# Patient Record
Sex: Male | Born: 1960 | Hispanic: No | Marital: Married | Smoking: Former smoker
Health system: Southern US, Community
[De-identification: ages and names within clinical notes are randomized; demographics above are authoritative.]

## PROBLEM LIST (undated history)

## (undated) DIAGNOSIS — K219 Gastro-esophageal reflux disease without esophagitis: Secondary | ICD-10-CM

## (undated) HISTORY — DX: Gastro-esophageal reflux disease without esophagitis: K21.9

---

## 2009-06-27 ENCOUNTER — Ambulatory Visit: Payer: Self-pay | Admitting: Internal Medicine

## 2009-06-27 DIAGNOSIS — N529 Male erectile dysfunction, unspecified: Secondary | ICD-10-CM

## 2009-06-27 DIAGNOSIS — K219 Gastro-esophageal reflux disease without esophagitis: Secondary | ICD-10-CM

## 2009-06-27 DIAGNOSIS — N401 Enlarged prostate with lower urinary tract symptoms: Secondary | ICD-10-CM | POA: Insufficient documentation

## 2009-07-01 ENCOUNTER — Encounter: Payer: Self-pay | Admitting: Internal Medicine

## 2010-03-31 ENCOUNTER — Telehealth: Payer: Self-pay | Admitting: Internal Medicine

## 2010-04-01 ENCOUNTER — Ambulatory Visit: Payer: Self-pay | Admitting: Internal Medicine

## 2010-04-01 DIAGNOSIS — N453 Epididymo-orchitis: Secondary | ICD-10-CM | POA: Insufficient documentation

## 2010-04-01 DIAGNOSIS — E291 Testicular hypofunction: Secondary | ICD-10-CM

## 2010-04-01 LAB — CONVERTED CEMR LAB
Bilirubin Urine: NEGATIVE
Hemoglobin, Urine: NEGATIVE
Ketones, ur: NEGATIVE mg/dL
Nitrite: NEGATIVE
Specific Gravity, Urine: 1.025 (ref 1.000–1.030)
Urine Glucose: NEGATIVE mg/dL
Urobilinogen, UA: 0.2 (ref 0.0–1.0)
pH: 6 (ref 5.0–8.0)

## 2010-04-18 ENCOUNTER — Ambulatory Visit: Payer: Self-pay | Admitting: Internal Medicine

## 2010-04-18 DIAGNOSIS — L989 Disorder of the skin and subcutaneous tissue, unspecified: Secondary | ICD-10-CM | POA: Insufficient documentation

## 2010-11-09 LAB — CONVERTED CEMR LAB
ALT: 32 units/L (ref 0–53)
BUN: 15 mg/dL (ref 6–23)
Basophils Relative: 0.8 % (ref 0.0–3.0)
Bilirubin Urine: NEGATIVE
CO2: 29 meq/L (ref 19–32)
Chloride: 104 meq/L (ref 96–112)
Direct LDL: 148.2 mg/dL
Eosinophils Relative: 5.8 % — ABNORMAL HIGH (ref 0.0–5.0)
HCT: 42.3 % (ref 39.0–52.0)
Hemoglobin, Urine: NEGATIVE
Ketones, ur: NEGATIVE mg/dL
Leukocytes, UA: NEGATIVE
Lymphs Abs: 2.1 10*3/uL (ref 0.7–4.0)
MCHC: 33.9 g/dL (ref 30.0–36.0)
MCV: 89.8 fL (ref 78.0–100.0)
Monocytes Absolute: 0.4 10*3/uL (ref 0.1–1.0)
Platelets: 230 10*3/uL (ref 150.0–400.0)
Potassium: 3.8 meq/L (ref 3.5–5.1)
RBC: 4.71 M/uL (ref 4.22–5.81)
TSH: 0.55 microintl units/mL (ref 0.35–5.50)
Testosterone: 135.8 ng/dL — ABNORMAL LOW (ref 350.00–890.00)
Total Protein: 8 g/dL (ref 6.0–8.3)
Urobilinogen, UA: 0.2 (ref 0.0–1.0)
VLDL: 16 mg/dL (ref 0.0–40.0)
WBC: 5.6 10*3/uL (ref 4.5–10.5)

## 2010-11-12 NOTE — Assessment & Plan Note (Signed)
Summary: GROIN PAIN/NWS   Vital Signs:  Patient profile:   50 year old male Height:      72 inches Weight:      234.25 pounds BMI:     31.88 O2 Sat:      98 % on Room air Temp:     98.2 degrees F oral Pulse rate:   70 / minute BP sitting:   118 / 80  (left arm) Cuff size:   regular  Vitals Entered By: Margaret Pyle, CMA (April 01, 2010 4:43 PM)  O2 Flow:  Room air CC: LT groin & LBP pain x 3 days   Primary Care Provider:  Etta Grandchild MD  CC:  LT groin & LBP pain x 3 days.  History of Present Illness: here with acute onset 3 days mild to mod left testicle tender adn swelling, assoc wtih radiating pain to the lower sacral area with urination, but no dysuria, freq, urgency, hematuria, flank pain, n/v, high fever and chills.  Seemed to start after an evening of ETOH indulgence and asks if this could be related.  Pt denies CP, sob, doe, wheezing, orthopnea, pnd, worsening LE edema, palps, dizziness or syncope   No prior hx of UTI, but hs remote hx or prior prostatitis with similar pain to the lower back.  No bowel change, or LE pain, weak, numb, wt loss, night sweats.   No change in minor prostatism symptoms , declines need for flomax trial.  ED symptoms persist but improved with PDE5.  Low testosterone noted recent labs  Problems Prior to Update: 1)  Orchitis  (ICD-604.90) 2)  Hypertrophy Prostate W/ur Obst & Oth Luts  (ICD-600.01) 3)  Routine General Medical Exam@[facility]  (ICD-V70.0) 4)  Erectile Dysfunction, Organic  (ICD-607.84) 5)  Gerd  (ICD-530.81)  Medications Prior to Update: 1)  Cialis 5 Mg Tabs (Tadalafil) .... Once Daily As Directed  Current Medications (verified): 1)  Cialis 5 Mg Tabs (Tadalafil) .... Once Daily As Directed 2)  Ciprofloxacin Hcl 500 Mg Tabs (Ciprofloxacin Hcl) .Marland Kitchen.. 1 By Mouth Two Times A Day  Allergies (verified): No Known Drug Allergies  Past History:  Past Medical History: Last updated: 06/27/2009 GERD  Past Surgical  History: Last updated: 06/27/2009 Denies surgical history  Social History: Last updated: 04/01/2010 Married Alcohol use-yes  Risk Factors: Alcohol Use: 2 (06/27/2009) >5 drinks/d w/in last 3 months: no (06/27/2009)  Risk Factors: Smoking Status: quit > 6 months (06/27/2009)  Social History: Reviewed history and no changes required. Married Alcohol use-yes  Review of Systems       all otherwise negative per pt -    Physical Exam  General:  alert and overweight-appearing.   Head:  normocephalic and atraumatic.   Eyes:  vision grossly intact, pupils equal, and pupils round.   Ears:  R ear normal and L ear normal.   Nose:  no external deformity and no nasal discharge.   Mouth:  no gingival abnormalities and pharynx pink and moist.   Neck:  supple and no masses.   Lungs:  normal respiratory effort and normal breath sounds.   Heart:  normal rate and regular rhythm.   Abdomen:  soft, non-tender, and normal bowel sounds.   Genitalia:  Testes bilaterally descended without nodularity, tenderness or masses. No scrotal masses or lesions. No penis lesions or urethral discharge. except for left testicle mild to mod tender and swelling Msk:  no spine tender, no flank tender, no sacral tender Extremities:  no  edema, no erythema  Neurologic:  strength normal in all extremities and gait normal.     Impression & Recommendations:  Problem # 1:  ORCHITIS (ICD-604.90) mild to mod, for urine studies, treat as above - wth cipro , f/u any worsening signs or symptoms  - d/w pt, unlikely to be related to ETOH use Orders: T-Culture, Urine (51884-16606) TLB-Udip w/ Micro (81001-URINE)  Problem # 2:  HYPERTROPHY PROSTATE W/UR OBST & OTH LUTS (ICD-600.01) stable overall by hx and exam, ok to continue meds/tx as is   Problem # 3:  ERECTILE DYSFUNCTION, ORGANIC (ICD-607.84)  His updated medication list for this problem includes:    Cialis 5 Mg Tabs (Tadalafil) ..... Once daily as  directed improved, Continue all previous medications as before this visit   Problem # 4:  HYPOGONADISM (ICD-257.2) ok to hold on hormone replacement - d/w pt at his request  Complete Medication List: 1)  Cialis 5 Mg Tabs (Tadalafil) .... Once daily as directed 2)  Ciprofloxacin Hcl 500 Mg Tabs (Ciprofloxacin hcl) .Marland Kitchen.. 1 by mouth two times a day  Patient Instructions: 1)  Please take all new medications as prescribed 2)  Continue all previous medications as before this visit  3)  Please go to the Lab in the basement for your urine tests today  4)  Please schedule an appointment with your primary doctor  - Dr Yetta Barre, in sept 2011 with CPX labs Prescriptions: CIPROFLOXACIN HCL 500 MG TABS (CIPROFLOXACIN HCL) 1 by mouth two times a day  #20 x 0   Entered and Authorized by:   Corwin Levins MD   Signed by:   Corwin Levins MD on 04/01/2010   Method used:   Print then Give to Patient   RxID:   [id number]

## 2010-11-12 NOTE — Assessment & Plan Note (Signed)
Summary: BOIL ON BACK /NWS  #   Vital Signs:  Patient profile:   50 year old male Height:      72 inches Weight:      234 pounds BMI:     31.85 O2 Sat:      96 % on Room air Temp:     98.5 degrees F oral Pulse rate:   74 / minute BP sitting:   122 / 80  (left arm) Cuff size:   regular  Vitals Entered By: Bill Salinas CMA (April 18, 2010 3:39 PM)  O2 Flow:  Room air  CC: pt here for evaluation of lump on his back/ ab   Primary Care Provider:  Etta Grandchild MD  CC:  pt here for evaluation of lump on his back/ ab.  History of Present Illness: here to f/u lump on the left upper back; wife has been more concerned lately but he does not think it enlarging, denies pain, redness, fever, drainage and does not hurt to lie on it.  Also concerned about occasional aching to the left testiscle, but denies recurrence of pain, sweling adn any GU symptoms such as dysuria, freq, urgency or hematuria.  Does have significant ED symptoms however though libido is intact, has ongoing stressors recently but denies worsening depressive symtpoms, suicidal ideation or panic.    Problems Prior to Update: 1)  Skin Lesion  (ICD-709.9) 2)  Hypogonadism  (ICD-257.2) 3)  Orchitis  (ICD-604.90) 4)  Hypertrophy Prostate W/ur Obst & Oth Luts  (ICD-600.01) 5)  Routine General Medical Exam@health  Care [facility]  (ICD-V70.0) 6)  Erectile Dysfunction, Organic  (ICD-607.84) 7)  Gerd  (ICD-530.81)  Medications Prior to Update: 1)  Cialis 5 Mg Tabs (Tadalafil) .... Once Daily As Directed 2)  Ciprofloxacin Hcl 500 Mg Tabs (Ciprofloxacin Hcl) .Marland Kitchen.. 1 By Mouth Two Times A Day  Current Medications (verified): 1)  Cialis 20 Mg Tabs (Tadalafil) .Marland Kitchen.. 1po Every Other Day As Needed  Allergies (verified): No Known Drug Allergies  Past History:  Past Medical History: Last updated: 06/27/2009 GERD  Past Surgical History: Last updated: 06/27/2009 Denies surgical history  Social History: Last updated:  04/01/2010 Married Alcohol use-yes  Risk Factors: Alcohol Use: 2 (06/27/2009) >5 drinks/d w/in last 3 months: no (06/27/2009)  Risk Factors: Smoking Status: quit > 6 months (06/27/2009)  Review of Systems       all otherwise negative per pt -    Physical Exam  General:  alert and overweight-appearing.   Head:  normocephalic and atraumatic.   Eyes:  vision grossly intact, pupils equal, and pupils round.   Ears:  R ear normal and L ear normal.   Nose:  no external deformity and no nasal discharge.   Mouth:  no gingival abnormalities and pharynx pink and moist.   Neck:  supple and no masses.   Lungs:  normal respiratory effort and normal breath sounds.   Heart:  normal rate and regular rhythm.   Genitalia:  Testes bilaterally descended without nodularity, tenderness or masses. No scrotal masses or lesions. No penis lesions or urethral discharge. Extremities:  no edema, no erythema  Skin:  left upper back approx 2 cm subq soft lesion without distinct margins, and not tender, red or d/c Psych:  moderately anxious.     Impression & Recommendations:  Problem # 1:  SKIN LESION (ICD-709.9) c/w lipoma, cyst or possbiel lesion such as dermatofibroma;  reassured, educated - ok to folllow with expectant management  Problem #  2:  ORCHITIS (ICD-604.90) exam benign, ok to follow  Problem # 3:  ERECTILE DYSFUNCTION, ORGANIC (ICD-607.84)  His updated medication list for this problem includes:    Cialis 20 Mg Tabs (Tadalafil) .Marland Kitchen... 1po every other day as needed treat as above, f/u any worsening signs or symptoms , ? psychogenic element  Complete Medication List: 1)  Cialis 20 Mg Tabs (Tadalafil) .Marland Kitchen.. 1po every other day as needed  Patient Instructions: 1)  Please take all new medications as prescribed 2)  Continue all previous medications as before this visit  3)  Please schedule a follow-up appointment in 2 months with CPX labs and:  v70.0 4)  total testosterone   607.84 Prescriptions: CIALIS 20 MG TABS (TADALAFIL) 1po every other day as needed  #5 x 11   Entered and Authorized by:   Corwin Levins MD   Signed by:   Corwin Levins MD on 04/18/2010   Method used:   Print then Give to Patient   RxID:   [id number]    Immunization History:  Tetanus/Td Immunization History:    Tetanus/Td:  historical (01/11/2006)

## 2010-11-12 NOTE — Progress Notes (Signed)
Summary: Call Report  Phone Note Other Incoming   Caller: [facility] Call Report Summary of Call: [facility] Triage Call Report Triage Record Num: [id number] Operator: Aundra Millet Patient Name: [facility] Call Date & Time: 03/30/2010 12:51:22AM Patient Phone: [phone] PCP: Sonda Primes Patient Gender: Male PCP Fax : [phone] Patient DOB: July 04, 1961 Practice Name: [facility] Reason for Call: Wife/ Rochelle calling - Today, 03/29/2010 am had onset of testicular pain and is tender to touch that has continued all day. Rn spoke to pt and he stated that tonight, he rates the testicular pain 1/ 10 and has been asleep. No urinary sx's. No fever. No injury. No swelling. RN advised to be seen at [facility] within the next 24 hrs but if increasing pain, swelling go to [facility] ED and pt agreed. Protocol(s) Used: Scrotum / Testicles Symptoms Recommended Outcome per Protocol: See Provider within 24 hours Reason for Outcome: Pain in testicle(s)/scrotum AND pain or felt something "pass" with urination Care Advice:  ~ Avoid sexual intercourse.  ~ Call provider if symptoms worsen or new symptoms develop.  ~ Call provider if symptoms become severe or recurring. Increase intake of fluids. Try to drink 8 oz. (.2 liter) every hour when awake, including unsweetened cranberry juice, unless on restricted fluids for other medical reasons. Take sips of fluid or eat ice chips if nauseated or vomiting.  ~ 06/ Initial call taken by: Margaret Pyle, CMA,  March 31, 2010 10:12 AM

## 2011-05-11 ENCOUNTER — Ambulatory Visit: Payer: Self-pay | Admitting: Internal Medicine

## 2011-05-11 ENCOUNTER — Encounter: Payer: Self-pay | Admitting: Internal Medicine

## 2011-05-15 ENCOUNTER — Other Ambulatory Visit (INDEPENDENT_AMBULATORY_CARE_PROVIDER_SITE_OTHER): Payer: 59

## 2011-05-15 ENCOUNTER — Ambulatory Visit (INDEPENDENT_AMBULATORY_CARE_PROVIDER_SITE_OTHER): Payer: 59 | Admitting: Internal Medicine

## 2011-05-15 ENCOUNTER — Encounter: Payer: Self-pay | Admitting: Internal Medicine

## 2011-05-15 VITALS — BP 116/80 | HR 79 | Temp 98.4°F | Resp 16 | Wt 235.0 lb

## 2011-05-15 DIAGNOSIS — R319 Hematuria, unspecified: Secondary | ICD-10-CM | POA: Insufficient documentation

## 2011-05-15 DIAGNOSIS — N410 Acute prostatitis: Secondary | ICD-10-CM

## 2011-05-15 DIAGNOSIS — N401 Enlarged prostate with lower urinary tract symptoms: Secondary | ICD-10-CM

## 2011-05-15 DIAGNOSIS — N159 Renal tubulo-interstitial disease, unspecified: Secondary | ICD-10-CM

## 2011-05-15 DIAGNOSIS — IMO0002 Reserved for concepts with insufficient information to code with codable children: Secondary | ICD-10-CM

## 2011-05-15 DIAGNOSIS — M5416 Radiculopathy, lumbar region: Secondary | ICD-10-CM

## 2011-05-15 LAB — COMPREHENSIVE METABOLIC PANEL
ALT: 29 U/L (ref 0–53)
AST: 22 U/L (ref 0–37)
Albumin: 4.4 g/dL (ref 3.5–5.2)
Alkaline Phosphatase: 61 U/L (ref 39–117)
BUN: 20 mg/dL (ref 6–23)
Potassium: 4.6 mEq/L (ref 3.5–5.1)
Sodium: 140 mEq/L (ref 135–145)
Total Protein: 7.3 g/dL (ref 6.0–8.3)

## 2011-05-15 LAB — CBC WITH DIFFERENTIAL/PLATELET
Basophils Absolute: 0 10*3/uL (ref 0.0–0.1)
Eosinophils Absolute: 0.1 10*3/uL (ref 0.0–0.7)
MCHC: 34.2 g/dL (ref 30.0–36.0)
MCV: 90.2 fl (ref 78.0–100.0)
Monocytes Absolute: 0.6 10*3/uL (ref 0.1–1.0)
Neutrophils Relative %: 53.2 % (ref 43.0–77.0)
Platelets: 239 10*3/uL (ref 150.0–400.0)
RDW: 13.5 % (ref 11.5–14.6)

## 2011-05-15 LAB — POCT URINALYSIS DIPSTICK
Bilirubin, UA: NEGATIVE
Leukocytes, UA: NEGATIVE
Nitrite, UA: NEGATIVE
pH, UA: 6

## 2011-05-15 MED ORDER — IBUPROFEN 600 MG PO TABS: 600.0000 mg | ORAL_TABLET | Freq: Four times a day (QID) | ORAL | Status: AC | PRN

## 2011-05-15 MED ORDER — CIPROFLOXACIN HCL 500 MG PO TABS: 500.0000 mg | ORAL_TABLET | Freq: Two times a day (BID) | ORAL | Status: AC

## 2011-05-15 NOTE — Assessment & Plan Note (Signed)
Treat the infection

## 2011-05-15 NOTE — Patient Instructions (Signed)
Prostatitis Prostatitis is an inflammation (the body's way of reacting to injury and/or infection) of the prostate gland. The prostate gland is a male organ. The gland is about the size and shape of a walnut. The prostate is located just below the bladder. It produces semen, which is a fluid that helps nourish and transport sperm. Prostatitis is the most common urinary tract problem in men younger than age 48. There are 4 categories of prostatitis:  I - Acute bacterial prostatitis.   II - Chronic bacterial prostatitis.   III - Chronic prostatitis and chronic pelvic pain syndrome (CPPS).   Inflammatory.   Non inflammatory.   IV - Asymptomatic inflammatory prostatitis.  Acute and chronic bacterial prostatitis are problems with bacterial infections of the prostate. "Acute" infection is usually a one-time problem. "Chronic" bacterial prostatitis is a condition with recurrent infection. It is usually caused by the same germ (bacteria). CPPS has symptoms similar to prostate infection. However, no infection is actually found. This condition can cause problems of ongoing pain. Currently, it cannot be cured. Treatments are available and aimed at symptom control.  Asymptomatic inflammatory prostatitis has no symptoms. It is a condition where infection-fighting cells are found by chance in the urine. The diagnosis is made most often during an exam for other conditions. Other conditions could be infertility or a high level of PSA (prostate-specific antigen) in the blood. SYMPTOMS Symptoms can vary depending upon the type of prostatitis that exists. There can also be overlap in symptoms. This can make diagnosis difficult. Symptoms: For Acute bacterial prostatitis  Painful urination.  Fever and/or chills.   Muscle and/or joint pains.   Low back pain.   Low abdominal pain.   Inability to empty bladder completely.   Sudden urges to urinate.  Frequent urination during the day.   Difficulty  starting urine stream.   Need to urinate several times at night (nocturia).   Weak urine stream.   Urethral (tube that carries urine from the bladder out of the body) discharge and dribbling after urination.   For Chronic bacterial prostatitis  Rectal pain.   Pain in the testicles, penis, or tip of the penis.   Pain in the space between the anus and scrotum (perineum).   Low back pain.   Low abdominal pain.   Problems with sexual function.   Painful ejaculation.   Bloody semen.   Inability to empty bladder completely.   Painful urination.   Sudden urges to urinate.   Frequent urination during the day.   Difficulty starting urine stream.   Need to urinate several times at night (nocturia).   Weak urine stream.   Dribbling after urination.   Urethral discharge.   For Chronic prostatitis and chronic pelvic pain syndrome (CPPS) Symptoms are the same as those for chronic bacterial prostatitis. Problems with sexual function are often the reason for seeking care. This important problem should be discussed with your caregiver. For Asymptomatic inflammatory prostatitis As noted above, there are no symptoms with this condition. DIAGNOSIS  Your caregiver may perform a rectal exam. This exam is to determine if the prostate is swollen and tender.   Sometimes blood work is performed. This is done to see if your white blood cell count is elevated. The Prostate Specific Antigen (PSA) is also measured. PSA is a blood test that can help detect early prostate cancer.   A urinalysis is done to find out what type of infection is present if this is a suspected cause. An additional  urinalysis may be done after a digital rectal exam. This is to see if white blood cells are pushed out of the prostate and into the urine. A low-grade infection of the prostate may not be found on the first urinalysis.  In more difficult cases, your caregiver may advise other tests. Tests could  include:  Urodynamics -- Tests the function of the bladder and the organs involved in triggering and controlling normal urination.   Urine flow rate.   Cystoscopy -- In this procedure, a thin, telescope-like tube with a light and tiny camera attached (cystoscope) is inserted into the bladder through the urethra. This allows the caregiver to see the inside of the urethra and bladder.   Electromyography -- This procedure tests how the muscles and nerves of the bladder work. It is focused on the muscles that control the anus and pelvic floor. These are the muscles between the anus and scrotum.  In people who show no signs of infection, certain uncommon infections might be causing constant or recurrent symptoms. These uncommon infections are difficult to detect. More work in medicine may help find solutions to these problems. TREATMENT Antibiotics are used to treat infections caused by germs. If the infection is not treated and becomes long lasting (chronic), it may become a lower grade infection with minor, continual problems. Without treatment, the prostate may develop a boil or furuncle (abscess). This may require surgical treatment. For those with chronic prostatitis and CPPS, it is important to work closely with your primary caregiver and urologist. For some, the medicines that are used to treat a non-cancerous, enlarged prostate (benign prostatic hypertrophy) may be helpful. Referrals to specialists other than urologists may be necessary. In rare cases when all treatments have been inadequate for pain control, an operation to remove the prostate may be recommended. This is very rare and before this is considered thorough discussion with your urologist is highly recommended.  In cases of secondary to chronic non-bacterial prostatitis, a good relationship with your urologist or primary caregiver is essential because it is often a recurrent prolonged condition that requires a good understanding of the  causes and a commitment to therapy aimed at controlling your symptoms. HOME CARE INSTRUCTIONS  Hot sitz baths for 20 minutes, 4 times per day, may help relieve pain.   Non-prescription pain killers may be used as your caregiver recommends if you have no allergies to them. Some illnesses or conditions prevent use of non-prescription drugs. If unsure, check with your caregiver. Take all medications as directed. Take the antibiotics for the prescribed length of time, even if you are feeling better.  SEEK MEDICAL CARE IF:  You have any worsening of the symptoms that originally brought you to your caregiver.   You have an oral temperature above 100.5.   You experience any side effects from medications prescribed.  SEEK IMMEDIATE MEDICAL CARE IF: You have an oral temperature above 100.5Back Pain (Lumbosacral Strain) Back pain is one of the most common causes of pain. There are many causes of back pain. Most are not serious conditions.  CAUSES Your backbone (spinal column) is made up of 24 main vertebral bodies, the sacrum, and the coccyx. These are held together by muscles and tough, fibrous tissue (ligaments). Nerve roots pass through the openings between the vertebrae. A sudden move or injury to the back may cause injury to, or pressure on, these nerves. This may result in localized back pain or pain movement (radiation) into the buttocks, down the leg, and into  the foot. Sharp, shooting pain from the buttock down the back of the leg (sciatica) is frequently associated with a ruptured (herniated) disc. Pain may be caused by muscle spasm alone. Your caregiver can often find the cause of your pain by the details of your symptoms and an exam. In some cases, you may need tests (such as X-rays). Your caregiver will work with you to decide if any tests are needed based on your specific exam. HOME CARE INSTRUCTIONS Avoid an underactive lifestyle. Active exercise, as directed by your caregiver, is your  greatest weapon against back pain.  Avoid hard physical activities (tennis, racquetball, water-skiing) if you are not in proper physical condition for it. This may aggravate and/or create problems.  If you have a back problem, avoid sports requiring sudden body movements. Swimming and walking are generally safer activities.  Maintain good posture.  Avoid becoming overweight (obese).  Use bed rest for only the most extreme, sudden (acute) episode. Your caregiver will help you determine how much bed rest is necessary.  For acute conditions, you may put ice on the injured area.  Put ice in a plastic bag.  Place a towel between your skin and the bag.  Leave the ice on for 20 minutes at a time, every 2 hours, or as needed.  After you are improved and more active, it may help to apply heat for 30 minutes before activities.  See your caregiver if you are having pain that lasts longer than expected. Your caregiver can advise appropriate exercises and/or therapy if needed. With conditioning, most back problems can be avoided. SEEK IMMEDIATE MEDICAL CARE IF: You have numbness, tingling, weakness, or problems with the use of your arms or legs.  You experience severe back pain not relieved with medicines.  There is a change in bowel or bladder control.  You have increasing pain in any area of the body, including your belly (abdomen).  You notice shortness of breath, dizziness, or feel faint.  You feel sick to your stomach (nauseous), are throwing up (vomiting), or become sweaty.  You notice discoloration of your toes or legs, or your feet get very cold.  Your back pain is getting worse.  You have an oral temperature above 100.5, not controlled by medicine.  MAKE SURE YOU:  Understand these instructions.  Will watch your condition.  Will get help right away if you are not doing well or get worse.  Document Released: 07/08/2005 Document Re-Released: 12/23/2009  [facility] Patient Information 2011  [place], [place]., not controlled by medicine.   You have pain not relieved with medications.   You develop nausea, vomiting, lightheadedness, or have a fainting episode.   You are unable to urinate.   You pass bloody urine or clots.  Document Released: 09/25/2000 Document Re-Released: 12/23/2009 [facility] Patient Information 2011 [place], [place].

## 2011-05-15 NOTE — Assessment & Plan Note (Signed)
Start cipro, check urine culture, screen for cancer with a PSA

## 2011-05-15 NOTE — Progress Notes (Signed)
Subjective:    Patient ID: Troy Rangel, male    DOB: [birth year], 50 y.o.   MRN: [id number]  Back Pain This is a chronic problem. The current episode started more than 1 year ago. The problem occurs intermittently. The problem has been gradually worsening since onset. The pain is present in the lumbar spine. The quality of the pain is described as aching and burning. The pain radiates to the right thigh. The pain is at a severity of 3/10. The pain is moderate. The pain is worse during the day. The symptoms are aggravated by bending, twisting and stress. Stiffness is present all day. Associated symptoms include leg pain. Pertinent negatives include no abdominal pain, bladder incontinence, bowel incontinence, chest pain, dysuria, fever, headaches, numbness, paresis, paresthesias, pelvic pain, perianal numbness, tingling, weakness or weight loss. Risk factors include lack of exercise and sedentary lifestyle. He has tried nothing for the symptoms.  Dysuria  This is a new problem. The current episode started 1 to 4 weeks ago. The problem occurs intermittently. The problem has been gradually worsening. The quality of the pain is described as burning. The pain is at a severity of 1/10. The pain is mild. There has been no fever. He is sexually active. There is no history of pyelonephritis. Pertinent negatives include no chills, discharge, flank pain, frequency, hematuria, hesitancy, nausea, sweats, urgency or vomiting. He has tried nothing for the symptoms.      Review of Systems  Constitutional: Negative for fever, chills, weight loss, diaphoresis, activity change, appetite change, fatigue and unexpected weight change.  HENT: Negative for sore throat, facial swelling, trouble swallowing, neck pain, neck stiffness and voice change.   Eyes: Negative for photophobia, redness and visual disturbance.  Respiratory: Negative for apnea, cough, choking, chest tightness, shortness of breath, wheezing and stridor.    Cardiovascular: Negative for chest pain, palpitations and leg swelling.  Gastrointestinal: Negative for nausea, vomiting, abdominal pain, diarrhea, constipation, abdominal distention and bowel incontinence.  Genitourinary: Negative for bladder incontinence, dysuria, hesitancy, urgency, frequency, hematuria, flank pain, decreased urine volume, discharge, penile swelling, scrotal swelling, enuresis, difficulty urinating, genital sores, penile pain, testicular pain and pelvic pain.  Musculoskeletal: Positive for back pain. Negative for myalgias, joint swelling, arthralgias and gait problem.  Skin: Negative for color change, pallor, rash and wound.  Neurological: Negative for dizziness, tingling, tremors, seizures, syncope, facial asymmetry, speech difficulty, weakness, light-headedness, numbness, headaches and paresthesias.  Hematological: Negative for adenopathy. Does not bruise/bleed easily.  Psychiatric/Behavioral: Negative for suicidal ideas, hallucinations, behavioral problems, confusion, sleep disturbance, self-injury, dysphoric mood, decreased concentration and agitation. The patient is not nervous/anxious and is not hyperactive.        Objective:   Physical Exam  Vitals reviewed. Constitutional: He is oriented to person, place, and time. He appears well-developed and well-nourished. No distress.  HENT:  Head: Normocephalic and atraumatic.  Right Ear: External ear normal.  Left Ear: External ear normal.  Nose: Nose normal.  Mouth/Throat: Oropharynx is clear and moist. No oropharyngeal exudate.  Eyes: Conjunctivae and EOM are normal. Pupils are equal, round, and reactive to light. Right eye exhibits no discharge. Left eye exhibits no discharge. No scleral icterus.  Neck: Normal range of motion. Neck supple. No JVD present. No tracheal deviation present. No thyromegaly present.  Cardiovascular: Normal rate, regular rhythm, normal heart sounds and intact distal pulses.  Exam reveals no  gallop and no friction rub.   No murmur heard. Pulmonary/Chest: Effort normal and breath sounds normal. No stridor. No  respiratory distress. He has no wheezes. He has no rales. He exhibits no tenderness.  Abdominal: Soft. Normal appearance and bowel sounds are normal. He exhibits no shifting dullness, no distension, no pulsatile liver, no fluid wave, no abdominal bruit, no ascites, no pulsatile midline mass and no mass. There is no hepatosplenomegaly, splenomegaly or hepatomegaly. There is no tenderness. There is no rebound, no guarding and no CVA tenderness. No hernia. Hernia confirmed negative in the ventral area, confirmed negative in the right inguinal area and confirmed negative in the left inguinal area.  Genitourinary: Rectum normal, testes normal and penis normal. Rectal exam shows no external hemorrhoid, no internal hemorrhoid, no fissure, no mass, no tenderness and anal tone normal. Guaiac negative stool. Prostate is enlarged (1+ boggy bilateral hypertrophy) and tender. Right testis shows no mass, no swelling and no tenderness. Right testis is descended. Cremasteric reflex is not absent on the right side. Left testis shows no mass, no swelling and no tenderness. Left testis is descended. Cremasteric reflex is not absent on the left side. Circumcised. No penile erythema or penile tenderness. No discharge found.  Musculoskeletal:       Lumbar back: Normal. He exhibits normal range of motion, no tenderness, no bony tenderness, no swelling, no edema, no deformity, no laceration, no pain, no spasm and normal pulse.  Lymphadenopathy:    He has no cervical adenopathy.       Right: No inguinal adenopathy present.       Left: No inguinal adenopathy present.  Neurological: He is alert and oriented to person, place, and time. He has normal strength. He is not disoriented. He displays no atrophy, no tremor and normal reflexes. No cranial nerve deficit or sensory deficit. He exhibits normal muscle tone. He  displays a negative Romberg sign. He displays no seizure activity. Coordination and gait normal.  Reflex Scores:      Tricep reflexes are 1+ on the right side and 1+ on the left side.      Bicep reflexes are 1+ on the right side and 1+ on the left side.      Brachioradialis reflexes are 1+ on the right side and 1+ on the left side.      Patellar reflexes are 1+ on the right side and 1+ on the left side.      Achilles reflexes are 1+ on the right side and 1+ on the left side. Skin: Skin is warm and dry. No rash noted. He is not diaphoretic. No erythema. No pallor.  Psychiatric: He has a normal mood and affect. His behavior is normal. Judgment and thought content normal.      Lab Results  Component Value Date   WBC 5.6 06/27/2009   HGB 14.3 06/27/2009   HCT 42.3 06/27/2009   PLT 230.0 06/27/2009   CHOL 203* 06/27/2009   TRIG 80.0 06/27/2009   HDL 45.50 06/27/2009   LDLDIRECT 148.2 06/27/2009   ALT 32 06/27/2009   AST 26 06/27/2009   NA 140 06/27/2009   K 3.8 06/27/2009   CL 104 06/27/2009   CREATININE 0.8 06/27/2009   BUN 15 06/27/2009   CO2 29 06/27/2009   TSH 0.55 06/27/2009   PSA 0.27 06/27/2009     Assessment & Plan:

## 2011-05-15 NOTE — Assessment & Plan Note (Signed)
Try some nsdais for pain and get MRI done as it sounds like he has a herniated disc with spinal stenosis +/- impingement

## 2011-05-15 NOTE — Assessment & Plan Note (Signed)
Check urine culture  

## 2011-05-17 LAB — CULTURE, URINE COMPREHENSIVE
Colony Count: NO GROWTH
Organism ID, Bacteria: NO GROWTH

## 2011-05-20 ENCOUNTER — Other Ambulatory Visit: Payer: Self-pay | Admitting: Internal Medicine

## 2011-05-20 DIAGNOSIS — M5416 Radiculopathy, lumbar region: Secondary | ICD-10-CM

## 2011-05-21 ENCOUNTER — Ambulatory Visit: Payer: Self-pay | Admitting: Internal Medicine

## 2011-06-12 ENCOUNTER — Encounter: Payer: 59 | Attending: Neurosurgery | Admitting: Neurosurgery

## 2012-06-10 ENCOUNTER — Emergency Department: Payer: Self-pay | Admitting: Emergency Medicine

## 2012-06-10 LAB — CBC
HCT: 41.5 %
HGB: 14.5 g/dL
MCH: 31.4 pg
MCHC: 34.9 g/dL
MCV: 90 fL
Platelet: 235 x10 3/mm 3
RBC: 4.61 x10 6/mm 3
RDW: 13.4 %
WBC: 5.2 x10 3/mm 3

## 2012-06-10 LAB — BASIC METABOLIC PANEL WITH GFR
Anion Gap: 7
BUN: 18 mg/dL
Calcium, Total: 8.7 mg/dL
Chloride: 104 mmol/L
Co2: 30 mmol/L
Creatinine: 1.12 mg/dL
EGFR (African American): >60
EGFR (Non-African Amer.): >60
Glucose: 113 mg/dL — ABNORMAL HIGH
Osmolality: 284
Potassium: 3.7 mmol/L
Sodium: 141 mmol/L

## 2012-06-10 LAB — CK TOTAL AND CKMB (NOT AT ARMC)
CK, Total: 171 U/L (ref 35–232)
CK-MB: 1.4 ng/mL (ref 0.5–3.6)

## 2012-06-10 LAB — TROPONIN I: Troponin-I: <0.02 ng/mL

## 2019-06-04 ENCOUNTER — Other Ambulatory Visit: Payer: Self-pay

## 2019-06-04 ENCOUNTER — Encounter: Payer: Self-pay | Admitting: Emergency Medicine

## 2019-06-04 ENCOUNTER — Inpatient Hospital Stay
Admission: EM | Admit: 2019-06-04 | Discharge: 2019-06-05 | DRG: 309 | Disposition: A | Discharge status: Home / Self Care | Payer: BLUE CROSS/BLUE SHIELD | Attending: Internal Medicine | Admitting: Internal Medicine

## 2019-06-04 ENCOUNTER — Emergency Department: Payer: BLUE CROSS/BLUE SHIELD

## 2019-06-04 DIAGNOSIS — Y900 Blood alcohol level of less than 20 mg/100 ml: Secondary | ICD-10-CM | POA: Diagnosis present

## 2019-06-04 DIAGNOSIS — K219 Gastro-esophageal reflux disease without esophagitis: Secondary | ICD-10-CM | POA: Diagnosis present

## 2019-06-04 DIAGNOSIS — K449 Diaphragmatic hernia without obstruction or gangrene: Secondary | ICD-10-CM | POA: Diagnosis present

## 2019-06-04 DIAGNOSIS — I4891 Unspecified atrial fibrillation: Principal | ICD-10-CM | POA: Diagnosis present

## 2019-06-04 DIAGNOSIS — Z8249 Family history of ischemic heart disease and other diseases of the circulatory system: Secondary | ICD-10-CM

## 2019-06-04 DIAGNOSIS — Z7982 Long term (current) use of aspirin: Secondary | ICD-10-CM

## 2019-06-04 DIAGNOSIS — Z20828 Contact with and (suspected) exposure to other viral communicable diseases: Secondary | ICD-10-CM | POA: Diagnosis present

## 2019-06-04 DIAGNOSIS — F10188 Alcohol abuse with other alcohol-induced disorder: Secondary | ICD-10-CM | POA: Diagnosis present

## 2019-06-04 DIAGNOSIS — F17290 Nicotine dependence, other tobacco product, uncomplicated: Secondary | ICD-10-CM | POA: Diagnosis present

## 2019-06-04 LAB — URINE DRUG SCREEN, QUALITATIVE (ARMC ONLY)
Amphetamines, Ur Screen: NOT DETECTED
Barbiturates, Ur Screen: NOT DETECTED
Benzodiazepine, Ur Scrn: NOT DETECTED
Cannabinoid 50 Ng, Ur ~~LOC~~: POSITIVE — AB
Cocaine Metabolite,Ur ~~LOC~~: NOT DETECTED
MDMA (Ecstasy)Ur Screen: NOT DETECTED
Methadone Scn, Ur: NOT DETECTED
Opiate, Ur Screen: NOT DETECTED
Phencyclidine (PCP) Ur S: NOT DETECTED
Tricyclic, Ur Screen: NOT DETECTED

## 2019-06-04 LAB — CBC WITH DIFFERENTIAL/PLATELET
Abs Immature Granulocytes: 0.03 10*3/uL (ref 0.00–0.07)
Basophils Absolute: 0 10*3/uL (ref 0.0–0.1)
Basophils Relative: 1 %
Eosinophils Absolute: 0.1 10*3/uL (ref 0.0–0.5)
Eosinophils Relative: 1 %
HCT: 42.1 % (ref 39.0–52.0)
Hemoglobin: 14.4 g/dL (ref 13.0–17.0)
Immature Granulocytes: 0 %
Lymphocytes Relative: 31 %
Lymphs Abs: 2.1 10*3/uL (ref 0.7–4.0)
MCH: 30.3 pg (ref 26.0–34.0)
MCHC: 34.2 g/dL (ref 30.0–36.0)
MCV: 88.6 fL (ref 80.0–100.0)
Monocytes Absolute: 0.6 10*3/uL (ref 0.1–1.0)
Monocytes Relative: 9 %
Neutro Abs: 3.9 10*3/uL (ref 1.7–7.7)
Neutrophils Relative %: 58 %
Platelets: 274 10*3/uL (ref 150–400)
RBC: 4.75 MIL/uL (ref 4.22–5.81)
RDW: 13 % (ref 11.5–15.5)
WBC: 6.7 10*3/uL (ref 4.0–10.5)
nRBC: 0 % (ref 0.0–0.2)

## 2019-06-04 LAB — COMPREHENSIVE METABOLIC PANEL
ALT: 27 U/L (ref 0–44)
AST: 24 U/L (ref 15–41)
Albumin: 3.9 g/dL (ref 3.5–5.0)
Alkaline Phosphatase: 65 U/L (ref 38–126)
Anion gap: 12 (ref 5–15)
BUN: 15 mg/dL (ref 6–20)
CO2: 22 mmol/L (ref 22–32)
Calcium: 8.7 mg/dL — ABNORMAL LOW (ref 8.9–10.3)
Chloride: 102 mmol/L (ref 98–111)
Creatinine, Ser: 0.7 mg/dL (ref 0.61–1.24)
GFR calc Af Amer: >60 mL/min (ref >60)
GFR calc non Af Amer: >60 mL/min (ref >60)
Glucose, Bld: 105 mg/dL — ABNORMAL HIGH (ref 70–99)
Potassium: 3.5 mmol/L (ref 3.5–5.1)
Sodium: 136 mmol/L (ref 135–145)
Total Bilirubin: 0.9 mg/dL (ref 0.3–1.2)
Total Protein: 7.3 g/dL (ref 6.5–8.1)

## 2019-06-04 LAB — PROTIME-INR
INR: 0.9 (ref 0.8–1.2)
Prothrombin Time: 12.5 seconds (ref 11.4–15.2)

## 2019-06-04 LAB — SARS CORONAVIRUS 2 (TAT 6-24 HRS): SARS Coronavirus 2: NEGATIVE

## 2019-06-04 LAB — MAGNESIUM: Magnesium: 1.9 mg/dL (ref 1.7–2.4)

## 2019-06-04 LAB — APTT: aPTT: 26 seconds (ref 24–36)

## 2019-06-04 LAB — TSH: TSH: 0.982 u[IU]/mL (ref 0.350–4.500)

## 2019-06-04 LAB — HEPARIN LEVEL (UNFRACTIONATED): Heparin Unfractionated: 0.39 IU/mL (ref 0.30–0.70)

## 2019-06-04 LAB — FIBRIN DERIVATIVES D-DIMER (ARMC ONLY): Fibrin derivatives D-dimer (ARMC): 245.55 ng/mL (FEU) (ref 0.00–499.00)

## 2019-06-04 LAB — TROPONIN I (HIGH SENSITIVITY)
Troponin I (High Sensitivity): 6 ng/L (ref <18)
Troponin I (High Sensitivity): 7 ng/L (ref <18)

## 2019-06-04 LAB — ETHANOL: Alcohol, Ethyl (B): <10 mg/dL (ref <10)

## 2019-06-04 MED ORDER — ONDANSETRON HCL 4 MG/2ML IJ SOLN: 4.0000 mg | Freq: Four times a day (QID) | INTRAMUSCULAR | Status: DC | PRN

## 2019-06-04 MED ORDER — DILTIAZEM LOAD VIA INFUSION
15.0000 mg | Freq: Once | INTRAVENOUS | Status: AC
  Administered 2019-06-04: 16:00:00 15 mg via INTRAVENOUS
  Filled 2019-06-04: qty 15

## 2019-06-04 MED ORDER — POLYETHYLENE GLYCOL 3350 17 G PO PACK: 17.0000 g | PACK | Freq: Every day | ORAL | Status: DC | PRN

## 2019-06-04 MED ORDER — LORAZEPAM 2 MG/ML IJ SOLN
1.0000 mg | Freq: Once | INTRAMUSCULAR | Status: AC
  Administered 2019-06-04: 16:00:00 1 mg via INTRAVENOUS
  Filled 2019-06-04: qty 1

## 2019-06-04 MED ORDER — LORAZEPAM 2 MG/ML IJ SOLN: 1.0000 mg | Freq: Four times a day (QID) | INTRAMUSCULAR | Status: DC | PRN

## 2019-06-04 MED ORDER — ADULT MULTIVITAMIN W/MINERALS CH
1.0000 | ORAL_TABLET | Freq: Every day | ORAL | Status: DC
  Administered 2019-06-04 – 2019-06-05 (×2): 1 via ORAL
  Filled 2019-06-04 (×2): qty 1

## 2019-06-04 MED ORDER — HEPARIN (PORCINE) 25000 UT/250ML-% IV SOLN
1500.0000 [IU]/h | INTRAVENOUS | Status: DC
  Administered 2019-06-04 – 2019-06-05 (×2): 1500 [IU]/h via INTRAVENOUS
  Filled 2019-06-04 (×2): qty 250

## 2019-06-04 MED ORDER — ONDANSETRON HCL 4 MG PO TABS: 4.0000 mg | ORAL_TABLET | Freq: Four times a day (QID) | ORAL | Status: DC | PRN

## 2019-06-04 MED ORDER — ACETAMINOPHEN 650 MG RE SUPP: 650.0000 mg | Freq: Four times a day (QID) | RECTAL | Status: DC | PRN

## 2019-06-04 MED ORDER — VITAMIN B-1 100 MG PO TABS
100.0000 mg | ORAL_TABLET | Freq: Every day | ORAL | Status: DC
  Administered 2019-06-04 – 2019-06-05 (×2): 100 mg via ORAL
  Filled 2019-06-04 (×2): qty 1

## 2019-06-04 MED ORDER — DILTIAZEM HCL 100 MG IV SOLR
5.0000 mg/h | INTRAVENOUS | Status: DC
  Administered 2019-06-04: 15 mg/h via INTRAVENOUS
  Administered 2019-06-04: 5 mg/h via INTRAVENOUS
  Filled 2019-06-04 (×2): qty 100

## 2019-06-04 MED ORDER — THIAMINE HCL 100 MG/ML IJ SOLN: 100.0000 mg | Freq: Every day | INTRAMUSCULAR | Status: DC

## 2019-06-04 MED ORDER — LORAZEPAM 1 MG PO TABS: 1.0000 mg | ORAL_TABLET | Freq: Four times a day (QID) | ORAL | Status: DC | PRN

## 2019-06-04 MED ORDER — FOLIC ACID 1 MG PO TABS
1.0000 mg | ORAL_TABLET | Freq: Every day | ORAL | Status: DC
  Administered 2019-06-04 – 2019-06-05 (×2): 1 mg via ORAL
  Filled 2019-06-04 (×2): qty 1

## 2019-06-04 MED ORDER — ASPIRIN EC 81 MG PO TBEC
81.0000 mg | DELAYED_RELEASE_TABLET | Freq: Every day | ORAL | Status: DC
  Administered 2019-06-05: 81 mg via ORAL
  Filled 2019-06-04: qty 1

## 2019-06-04 MED ORDER — ACETAMINOPHEN 325 MG PO TABS: 650.0000 mg | ORAL_TABLET | Freq: Four times a day (QID) | ORAL | Status: DC | PRN

## 2019-06-04 MED ORDER — HEPARIN BOLUS VIA INFUSION
4000.0000 [IU] | Freq: Once | INTRAVENOUS | Status: AC
  Administered 2019-06-04: 4000 [IU] via INTRAVENOUS
  Filled 2019-06-04: qty 4000

## 2019-06-04 NOTE — Consult Note (Signed)
[facility] for Heparin dosing  Indication: atrial fibrillation  No Known Allergies  Patient Measurements: Height: 6' (182.9 cm) Weight: 240 lb (108.9 kg) IBW/kg (Calculated) : 77.6 Heparin Dosing Weight: 100.6 kg   Vital Signs: Temp: 98.1 F (36.7 C) (08/23 1511) Temp Source: Oral (08/23 1511) BP: 132/85 (08/23 1511) Pulse Rate: 133 (08/23 1511)  Labs: No results for input(s): HGB, HCT, PLT, APTT, LABPROT, INR, HEPARINUNFRC, HEPRLOWMOCWT, CREATININE, CKTOTAL, CKMB, TROPONINIHS in the last 72 hours.  CrCl cannot be calculated (Patient's most recent lab result is older than the maximum 21 days allowed.).   Medications:  Medication reconciliation incomplete at this time. Confirmed with patient he has not had anticoagulants PTA.    Assessment: Pharmacy was consulted for heparin dosing for atrial fibrillation. Will need to order BL (PT/INR and aPPT); CBC pending.   Goal of Therapy:  Heparin level 0.3-0.7 units/ml Monitor platelets by anticoagulation protocol: Yes   Plan:  Baseline labs have been ordered  Heparin DW: 100.6 kg Give 4000 units bolus x 1 Start heparin infusion at 1500 units/hr Check anti-Xa level in 6 hours and daily while on heparin, per protocol Continue to monitor H&H and platelets  Thank you for allowing pharmacy to be a part of this patient's care.  Rowland Lathe 06/04/2019,4:02 PM

## 2019-06-04 NOTE — ED Triage Notes (Signed)
Pt presents from home via acems with c/o chest discomfort and palpitations. Pt 180 bpm afib with rvr for ems. Pt denies any cardiac hx. 5 mg metoprolol given by ems. Pt respirations even and unlabored. Pt alert and oriented x4.

## 2019-06-04 NOTE — H&P (Addendum)
[facility] [place] [facility]   PATIENT NAME: Troy Rangel    MR#:  [id number]  DATE OF BIRTH:  May 26, 1961  DATE OF ADMISSION:  06/04/2019  PRIMARY CARE PHYSICIAN: Patient, No Pcp Per   REQUESTING/REFERRING PHYSICIAN: Shaune Pollackameron Isaacs, MD  CHIEF COMPLAINT:   Chief Complaint  Patient presents with  . Irregular Heart Beat    HISTORY OF PRESENT ILLNESS:  Troy Rangel  is a 58 y.o. male with a known history of GERD and alcohol use who presented to the ED with palpitations and chest discomfort for the last week.  His symptoms have been occurring off and on, more commonly at night.  His symptoms are not related to exertion.  He denies any shortness of breath or dizziness.  He states that his palpitations got much worse this morning after drinking some coffee.  In the ED, was noted to be in new onset A. fib with RVR with heart rates in the 140s.  Labs were unremarkable.  Chest x-ray showed no acute cardiopulmonary disease, but did show a moderate size hiatal hernia.  He was started on a diltiazem drip and heparin drip.  Hospitalists were called for admission.  PAST MEDICAL HISTORY:   Past Medical History:  Diagnosis Date  . GERD (gastroesophageal reflux disease)     PAST SURGICAL HISTORY:  No past surgical history on file.  SOCIAL HISTORY:   Social History   Tobacco Use  . Smoking status: Former Smoker  Substance Use Topics  . Alcohol use: Yes    FAMILY HISTORY:  Father- heart attack at the age of 56  DRUG ALLERGIES:  No Known Allergies  REVIEW OF SYSTEMS:   Review of Systems  Constitutional: Negative for chills and fever.  HENT: Negative for congestion and sore throat.   Eyes: Negative for blurred vision and double vision.  Respiratory: Negative for cough and shortness of breath.   Cardiovascular: Negative for chest pain and palpitations.  Gastrointestinal: Negative for nausea and vomiting.  Genitourinary: Negative for dysuria and  urgency.  Musculoskeletal: Negative for back pain and neck pain.  Neurological: Negative for dizziness and headaches.  Psychiatric/Behavioral: Negative for depression. The patient is not nervous/anxious.     MEDICATIONS AT HOME:   Prior to Admission medications   Medication Sig Start Date End Date Taking? Authorizing Provider  aspirin EC 81 MG tablet Take 81 mg by mouth daily.    [provider]  tadalafil (CIALIS) 20 MG tablet Take 20 mg by mouth daily as needed.      [provider]      VITAL SIGNS:  Blood pressure 120/74, pulse (!) 118, temperature 98.1 F (36.7 C), temperature source Oral, resp. rate 17, height 6' (1.829 m), weight 108.9 kg, SpO2 96 %.  PHYSICAL EXAMINATION:  Physical Exam  GENERAL:  58 y.o.-year-old patient lying in the bed with no acute distress.  EYES: Pupils equal, round, reactive to light and accommodation. No scleral icterus. Extraocular muscles intact.  HEENT: Head atraumatic, normocephalic. Oropharynx and nasopharynx clear.  NECK:  Supple, no jugular venous distention. No thyroid enlargement, no tenderness.  LUNGS: Normal breath sounds bilaterally, no wheezing, rales,rhonchi or crepitation. No use of accessory muscles of respiration.  CARDIOVASCULAR: Irregularly irregular rhythm, tachycardic, S1, S2 normal. No murmurs, rubs, or gallops.  ABDOMEN: Soft, nontender, nondistended. Bowel sounds present. No organomegaly or mass.  EXTREMITIES: No pedal edema, cyanosis, or clubbing.  NEUROLOGIC: Cranial nerves II through XII are intact. Muscle strength 5/5 in  all extremities. Sensation intact. Gait not checked.  PSYCHIATRIC: The patient is alert and oriented x 3.  SKIN: No obvious rash, lesion, or ulcer.   LABORATORY PANEL:   CBC Recent Labs  Lab 06/04/19 1509  WBC 6.7  HGB 14.4  HCT 42.1  PLT 274   ------------------------------------------------------------------------------------------------------------------  Chemistries   Recent Labs  Lab 06/04/19 1509  NA 136  K 3.5  CL 102  CO2 22  GLUCOSE 105*  BUN 15  CREATININE 0.70  CALCIUM 8.7*  MG 1.9  AST 24  ALT 27  ALKPHOS 65  BILITOT 0.9   ------------------------------------------------------------------------------------------------------------------  Cardiac Enzymes No results for input(s): TROPONINI in the last 168 hours. ------------------------------------------------------------------------------------------------------------------  RADIOLOGY:  Dg Chest Portable 1 View  Result Date: 06/04/2019 CLINICAL DATA:  Shortness of breath EXAM: PORTABLE CHEST 1 VIEW COMPARISON:  None. FINDINGS: Moderate-sized hiatal hernia. Heart is normal size. No confluent opacities or effusions. No acute bony abnormality. IMPRESSION: No acute cardiopulmonary disease. Moderate-sized hiatal hernia. Electronically Signed   By: Rolm Baptise M.D.   On: 06/04/2019 16:41      IMPRESSION AND PLAN:   New onset atrial fibrillation with RVR -Continue diltiazem gtt and heparin gtt -Cardiology consult -ECHO -Check TSH -Check d-dimer to assess for PE as a possible precipitating factor -Cardiac monitoring  Chest discomfort- likely due to above.  Initial troponin was negative. -Trend troponins -Check lipid panel and A1c -ECHO as above -Continue home aspirin -Patient may benefit from outpatient stress test  Alcohol abuse- drinks 6-12 beers daily -CIWA -Alcohol cessation counseling performed by me   All the records are reviewed and case discussed with ED provider. Management plans discussed with the patient, family and they are in agreement.  CODE STATUS: Full  TOTAL TIME TAKING CARE OF THIS PATIENT: 45 minutes.    Berna Spare Ozell Juhasz M.D on 06/04/2019 at 5:39 PM  Between 7am to 6pm - Pager [phone]  After 6pm go to [email or web address] - Proofreader  [facility]  Office  [phone]  CC: Primary care physician; Patient, No Pcp  Per   Note: This dictation was prepared with Dragon dictation along with smaller phrase technology. Any transcriptional errors that result from this process are unintentional.

## 2019-06-04 NOTE — ED Notes (Signed)
.. ED TO INPATIENT HANDOFF REPORT  ED Nurse Name and Phone #: Pattricia Bossnnie [phone]  S Name/Age/Gender Troy Rangel 58 y.o. male Room/Bed: ED19A/ED19A  Code Status   Code Status: Not on file  Home/SNF/Other Home Patient oriented to: self, place, time and situation Is this baseline? Yes   Triage Complete: Triage complete  Chief Complaint a-fib  Triage Note Pt presents from home via acems with c/o chest discomfort and palpitations. Pt 180 bpm afib with rvr for ems. Pt denies any cardiac hx. 5 mg metoprolol given by ems. Pt respirations even and unlabored. Pt alert and oriented x4.   Allergies No Known Allergies  Level of Care/Admitting Diagnosis ED Disposition    ED Disposition Condition Comment   Admit  The patient appears reasonably stabilized for admission considering the current resources, flow, and capabilities available in the ED at this time, and I doubt any other [facility] requiring further screening and/or treatment in the ED prior to admission is  present.       B Medical/Surgery History Past Medical History:  Diagnosis Date  . GERD (gastroesophageal reflux disease)    No past surgical history on file.   A IV Location/Drains/Wounds Patient Lines/Drains/Airways Status   Active Line/Drains/Airways    Name:   Placement date:   Placement time:   Site:   Days:   Peripheral IV 06/04/19 Left Antecubital   06/04/19    1608    Antecubital   less than 1   Peripheral IV 06/04/19 Left Hand   06/04/19    1620    Hand   less than 1          Intake/Output Last 24 hours No intake or output data in the 24 hours ending 06/04/19 1653  Labs/Imaging Results for orders placed or performed during the hospital encounter of 06/04/19 (from the past 48 hour(s))  CBC with Differential     Status: None   Collection Time: 06/04/19  3:09 PM  Result Value Ref Range   WBC 6.7 4.0 - 10.5 K/uL   RBC 4.75 4.22 - 5.81 MIL/uL   Hemoglobin 14.4 13.0 - 17.0 g/dL   HCT 16.142.1 09.639.0 - 04.552.0 %   MCV 88.6 80.0 - 100.0 fL   MCH 30.3 26.0 - 34.0 pg   MCHC 34.2 30.0 - 36.0 g/dL   RDW 40.913.0 81.111.5 - 91.415.5 %   Platelets 274 150 - 400 K/uL   nRBC 0.0 0.0 - 0.2 %   Neutrophils Relative % 58 %   Neutro Abs 3.9 1.7 - 7.7 K/uL   Lymphocytes Relative 31 %   Lymphs Abs 2.1 0.7 - 4.0 K/uL   Monocytes Relative 9 %   Monocytes Absolute 0.6 0.1 - 1.0 K/uL   Eosinophils Relative 1 %   Eosinophils Absolute 0.1 0.0 - 0.5 K/uL   Basophils Relative 1 %   Basophils Absolute 0.0 0.0 - 0.1 K/uL   Immature Granulocytes 0 %   Abs Immature Granulocytes 0.03 0.00 - 0.07 K/uL    Comment: Performed at [facility], [address] [place], [place] [address]  Comprehensive metabolic panel     Status: Abnormal   Collection Time: 06/04/19  3:09 PM  Result Value Ref Range   Sodium 136 135 - 145 mmol/L   Potassium 3.5 3.5 - 5.1 mmol/L   Chloride 102 98 - 111 mmol/L   CO2 22 22 - 32 mmol/L   Glucose, Bld 105 (H) 70 - 99 mg/dL   BUN 15  6 - 20 mg/dL   Creatinine, Ser 0.70 0.61 - 1.24 mg/dL   Calcium 8.7 (L) 8.9 - 10.3 mg/dL   Total Protein 7.3 6.5 - 8.1 g/dL   Albumin 3.9 3.5 - 5.0 g/dL   AST 24 15 - 41 U/L   ALT 27 0 - 44 U/L   Alkaline Phosphatase 65 38 - 126 U/L   Total Bilirubin 0.9 0.3 - 1.2 mg/dL   GFR calc non Af Amer >60 >60 mL/min   GFR calc Af Amer >60 >60 mL/min   Anion gap 12 5 - 15    Comment: Performed at [facility], [address] [place], [place] [address]  Magnesium     Status: None   Collection Time: 06/04/19  3:09 PM  Result Value Ref Range   Magnesium 1.9 1.7 - 2.4 mg/dL    Comment: Performed at [facility], [place], [place] [address]  Troponin I (High Sensitivity)     Status: None   Collection Time: 06/04/19  3:09 PM  Result Value Ref Range   Troponin I (High Sensitivity) 6 <18 ng/L    Comment: (NOTE) Elevated high sensitivity troponin I (hsTnI) values and significant  changes across serial measurements may suggest ACS but many  other  chronic and acute conditions are known to elevate hsTnI results.  Refer to the "Links" section for chest pain algorithms and additional  guidance. Performed at [facility], [address] [place], [place] [address]   Ethanol     Status: None   Collection Time: 06/04/19  3:09 PM  Result Value Ref Range   Alcohol, Ethyl (B) <10 <10 mg/dL    Comment: (NOTE) Lowest detectable limit for serum alcohol is 10 mg/dL. For medical purposes only. Performed at [facility], [address] [place], [place] [address]   TSH     Status: None   Collection Time: 06/04/19  3:09 PM  Result Value Ref Range   TSH 0.982 0.350 - 4.500 uIU/mL    Comment: Performed by a 3rd Generation assay with a functional sensitivity of <=0.01 uIU/mL. Performed at [facility], [address] [place], [place] [address]   APTT     Status: None   Collection Time: 06/04/19  3:09 PM  Result Value Ref Range   aPTT 26 24 - 36 seconds    Comment: Performed at [facility], [address] [place], [place] [address]  Protime-INR     Status: None   Collection Time: 06/04/19  3:09 PM  Result Value Ref Range   Prothrombin Time 12.5 11.4 - 15.2 seconds   INR 0.9 0.8 - 1.2    Comment: (NOTE) INR goal varies based on device and disease states. Performed at [facility], [address] [place], [place] [address]    Dg Chest Portable 1 View  Result Date: 06/04/2019 CLINICAL DATA:  Shortness of breath EXAM: PORTABLE CHEST 1 VIEW COMPARISON:  None. FINDINGS: Moderate-sized hiatal hernia. Heart is normal size. No confluent opacities or effusions. No acute bony abnormality. IMPRESSION: No acute cardiopulmonary disease. Moderate-sized hiatal hernia. Electronically Signed   By: Rolm Baptise M.D.   On: 06/04/2019 16:41    Pending Labs Unresulted Labs (From admission, onward)    Start     Ordered   06/05/19 0500  CBC  Tomorrow morning,   STAT     06/04/19 1611    06/04/19 2230  Heparin level (unfractionated)  Once-Timed,  STAT     06/04/19 1609   06/04/19 1653  SARS CORONAVIRUS 2 Nasal Swab Aptima Multi Swab  (Asymptomatic/Tier 2 Patients Labs)  Once,   STAT    Question Answer Comment  Is this test for diagnosis or screening Screening   Symptomatic for COVID-19 as defined by CDC No   Hospitalized for COVID-19 No   Admitted to ICU for COVID-19 No   Previously tested for COVID-19 No   Resident in a congregate (group) care setting No   Employed in healthcare setting No      06/04/19 1653   06/04/19 1557  Urine Drug Screen, Qualitative ([facility] only)  Once,   STAT     06/04/19 1556          Vitals/Pain Today's Vitals   06/04/19 1511 06/04/19 1530 06/04/19 1630 06/04/19 1648  BP: 132/85 (!) 145/73 99/81   Pulse: (!) 133  (!) 103 85  Resp: 17  (!) 24 16  Temp: 98.1 F (36.7 C)     TempSrc: Oral     SpO2: 100% 100% 96% 96%  Weight:      Height:      PainSc:        Isolation Precautions No active isolations  Medications Medications  diltiazem (CARDIZEM) 1 mg/mL load via infusion 15 mg (15 mg Intravenous Bolus from Bag 06/04/19 1624)    And  diltiazem (CARDIZEM) 100 mg in dextrose 5 % 100 mL (1 mg/mL) infusion (5 mg/hr Intravenous New Bag/Given 06/04/19 1624)  heparin ADULT infusion 100 units/mL (25000 units/25750mL sodium chloride 0.45%) (1,500 Units/hr Intravenous New Bag/Given 06/04/19 1627)  LORazepam (ATIVAN) injection 1 mg (1 mg Intravenous Given 06/04/19 1607)  heparin bolus via infusion 4,000 Units (4,000 Units Intravenous Bolus from Bag 06/04/19 1629)    Mobility walks Low fall risk   Focused Assessments Cardiac Assessment Handoff:  Cardiac Rhythm: Atrial fibrillation Lab Results  Component Value Date   CKTOTAL 171 06/10/2012   CKMB 1.4 06/10/2012   TROPONINI < 0.02 06/10/2012   No results found for: DDIMER Does the Patient currently have chest pain? No     R Recommendations: See Admitting Provider Note  Report  given to:   Additional Notes:

## 2019-06-04 NOTE — Consult Note (Signed)
ANTICOAGULATION CONSULT NOTE   Pharmacy Consult for Heparin dosing  Indication: atrial fibrillation  No Known Allergies  Patient Measurements: Height: 6' (182.9 cm) Weight: 235 lb (106.6 kg) IBW/kg (Calculated) : 77.6 Heparin Dosing Weight: 100.6 kg   Vital Signs: Temp: 98.3 F (36.8 C) (08/23 2012) Temp Source: Oral (08/23 2012) BP: 140/95 (08/23 2012) Pulse Rate: 39 (08/23 2012)  Labs: Recent Labs    06/04/19 1509 06/04/19 2233  HGB 14.4  --   HCT 42.1  --   PLT 274  --   APTT 26  --   LABPROT 12.5  --   INR 0.9  --   HEPARINUNFRC  --  0.39  CREATININE 0.70  --   TROPONINIHS 6  --     Estimated Creatinine Clearance: 131.6 mL/min (by C-G formula based on SCr of 0.7 mg/dL).   Medications:  Medication reconciliation incomplete at this time. Confirmed with patient he has not had anticoagulants PTA.    0823 @ 2233 HL = 0.39, therapeutic x 1  Assessment: Pharmacy was consulted for heparin dosing for atrial fibrillation. Will need to order BL (PT/INR and aPPT); CBC pending.   Goal of Therapy:  Heparin level 0.3-0.7 units/ml Monitor platelets by anticoagulation protocol: Yes   Plan:  Heparin DW: 100.6 kg Continue heparin infusion at 1500 units/hr Check anti-Xa level at 0500 to confirm and daily while on heparin, per protocol Continue to monitor H&H and platelets  Thank you for allowing pharmacy to be a part of this patient's care.  Hart Robinsons A 06/04/2019,10:58 PM

## 2019-06-04 NOTE — ED Provider Notes (Signed)
[facility] Emergency Department Provider Note  ____________________________________________   First MD Initiated Contact with Patient 06/04/19 [phone]     (approximate)  I have reviewed the triage vital signs and the nursing notes.   HISTORY  Chief Complaint Irregular Heart Beat    HPI Troy Rangel is a 58 y.o. male with past medical history as below here with palpitations.  The patient states that over the last week, he has had intermittent episodes in which she feels palpitations.  Feels like his heart is beating very quickly.  He is had some shortness of breath with it.  He states that over the last several hours, he has had significant worsening of this and he feels anxious, short of breath, and like his heart is beating quickly.  He has had a mild intermittent chest pressure with it.  Of note, he admits to a family history of coronary disease.  He admits to regular alcohol use and also drank coffee this morning.  He also had a beer this morning.  No fevers or chills.  No known history of coronary disease or A. fib.  No other complaints.  Symptoms seem worse when he goes to bed at night or when he is not distracting himself.  Denies drug use.        Past Medical History:  Diagnosis Date  . GERD (gastroesophageal reflux disease)     Patient Active Problem List   Diagnosis Date Noted  . Kidney infection 05/15/2011  . Prostatitis, acute 05/15/2011  . Hematuria 05/15/2011  . Lumbar radiculitis 05/15/2011  . HYPOGONADISM 04/01/2010  . GERD 06/27/2009  . 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  Prior to Admission medications   Medication Sig Start Date End Date Taking? Authorizing Provider  aspirin EC 81 MG tablet Take 81 mg by mouth daily.    [provider]  tadalafil (CIALIS) 20 MG tablet Take 20 mg by mouth daily as needed.      [provider]    Allergies Patient has no known allergies.  No family history on file.  Social History Social History   Tobacco Use  . Smoking status: Former Smoker  Substance Use Topics  . Alcohol use: Yes  . Drug use: No    Review of Systems  Review of Systems  Constitutional: Positive for fatigue. Negative for chills and fever.  HENT: Negative for sore throat.   Respiratory: Positive for shortness of breath.   Cardiovascular: Positive for chest pain and palpitations.  Gastrointestinal: Negative for abdominal pain.  Genitourinary: Negative for flank pain.  Musculoskeletal: Negative for neck pain.  Skin: Negative for rash and wound.  Allergic/Immunologic: Negative for immunocompromised state.  Neurological: Negative for weakness and numbness.  Hematological: Does not bruise/bleed easily.  All other systems reviewed and are negative.    ____________________________________________  PHYSICAL EXAM:      VITAL SIGNS: ED Triage Vitals  Enc Vitals Group     BP 06/04/19 1511 132/85     Pulse Rate 06/04/19 1511 (!) 133     Resp 06/04/19 1511 17     Temp 06/04/19 1511 98.1 F (36.7 C)     Temp Source 06/04/19 1511 Oral     SpO2 06/04/19 1511 100 %     Weight 06/04/19 1506 240 lb (108.9 kg)     Height 06/04/19 1506 6' (1.829 m)  Head Circumference --      Peak Flow --      Pain Score 06/04/19 1506 0     Pain Loc --      Pain Edu? --      Excl. in GC? --      Physical Exam Vitals signs and nursing note reviewed.  Constitutional:      General: He is not in acute distress.    Appearance: He is well-developed.  HENT:     Head: Normocephalic and atraumatic.  Eyes:     Conjunctiva/sclera: Conjunctivae normal.  Neck:     Musculoskeletal: Neck supple.  Cardiovascular:     Rate and Rhythm: Tachycardia present. Rhythm irregularly irregular.     Heart sounds: Normal heart sounds. No murmur. No friction rub.  Pulmonary:     Effort: Pulmonary effort is normal.  Tachypnea present. No respiratory distress.     Breath sounds: Normal breath sounds. No wheezing or rales.  Abdominal:     General: There is no distension.     Palpations: Abdomen is soft.     Tenderness: There is no abdominal tenderness.  Skin:    General: Skin is warm.     Capillary Refill: Capillary refill takes less than 2 seconds.  Neurological:     Mental Status: He is alert and oriented to person, place, and time.     Motor: No abnormal muscle tone.  Psychiatric:        Mood and Affect: Mood is anxious.       ____________________________________________   LABS (all labs ordered are listed, but only abnormal results are displayed)  Labs Reviewed  COMPREHENSIVE METABOLIC PANEL - Abnormal; Notable for the following components:      Result Value   Glucose, Bld 105 (*)    Calcium 8.7 (*)    All other components within normal limits  SARS CORONAVIRUS 2  CBC WITH DIFFERENTIAL/PLATELET  MAGNESIUM  ETHANOL  TSH  APTT  PROTIME-INR  URINE DRUG SCREEN, QUALITATIVE ([facility] ONLY)  HEPARIN LEVEL (UNFRACTIONATED)  CBC  TROPONIN I (HIGH SENSITIVITY)  TROPONIN I (HIGH SENSITIVITY)    ____________________________________________  EKG: Atrial fibrillation with rapid ventricular rate.  Ventricular rate 137.  No acute ST elevations or depressions.  No ischemic changes. No old tracings. ________________________________________  RADIOLOGY All imaging, including plain films, CT scans, and ultrasounds, independently reviewed by me, and interpretations confirmed via formal radiology reads.  ED MD interpretation:   CXR: Negative  Official radiology report(s): Dg Chest Portable 1 View  Result Date: 06/04/2019 CLINICAL DATA:  Shortness of breath EXAM: PORTABLE CHEST 1 VIEW COMPARISON:  None. FINDINGS: Moderate-sized hiatal hernia. Heart is normal size. No confluent opacities or effusions. No acute bony abnormality. IMPRESSION: No acute cardiopulmonary disease. Moderate-sized hiatal  hernia. Electronically Signed   By: Charlett NoseKevin  Dover M.D.   On: 06/04/2019 16:41    ____________________________________________  PROCEDURES   Procedure(s) performed (including Critical Care):  .Critical Care Performed by: Shaune PollackIsaacs, Elloise Roark, MD Authorized by: Shaune PollackIsaacs, Elania Crowl, MD   Critical care provider statement:    Critical care time (minutes):  35   Critical care time was exclusive of:  Separately billable procedures and treating other patients and teaching time   Critical care was necessary to treat or prevent imminent or life-threatening deterioration of the following conditions:  Cardiac failure, circulatory failure and respiratory failure   Critical care was time spent personally by me on the following activities:  Development of treatment plan with patient or surrogate, discussions  with consultants, evaluation of patient's response to treatment, examination of patient, obtaining history from patient or surrogate, ordering and performing treatments and interventions, ordering and review of laboratory studies, ordering and review of radiographic studies, pulse oximetry, re-evaluation of patient's condition and review of old charts   I assumed direction of critical care for this patient from another provider in my specialty: no      ____________________________________________  INITIAL IMPRESSION / MDM / ASSESSMENT AND PLAN / ED COURSE  As part of my medical decision making, I reviewed the following data within the electronic MEDICAL RECORD NUMBER Notes from prior ED visits and [place] Controlled Substance Database      *Allena KatzShayne E Carol was evaluated in Emergency Department on 06/04/2019 for the symptoms described in the history of present illness. He was evaluated in the context of the global COVID-19 pandemic, which necessitated consideration that the patient might be at risk for infection with the SARS-CoV-2 virus that causes COVID-19. Institutional protocols and algorithms that pertain to the  evaluation of patients at risk for COVID-19 are in a state of rapid change based on information released by regulatory bodies including the CDC and federal and state organizations. These policies and algorithms were followed during the patient's care in the ED.  Some ED evaluations and interventions may be delayed as a result of limited staffing during the pandemic.*      Medical Decision Making: 58 yo M with PMHx as above here with new onset atrial fibrillation with rapid ventricular response.  Suspect this is secondary to age, mild obesity, alcohol use, caffeine use.  Heart rate is improved on diltiazem drip.  No signs of acute ischemia on EKG or lab work.  Electrolytes acceptable.  Admit to medicine.  CHA2Ds2-VASc Score for Atrial Fibrillation    Patient Score  Age <65 = 0 65-74 = 1 > 75 = 2 0  Sex Male = 0 Male = 1 0  CHF History No = 0  Yes = 1 0  HTN History No = 0  Yes = 1 1  Stroke/TIA/TE History No = 0  Yes = 1 0  Vascular Disease History No = 0  Yes = 1 0  Diabetes History No = 0  Yes = 1 0  Total:  1   0.6 % stroke rate/year from a score of 1   ____________________________________________  FINAL CLINICAL IMPRESSION(S) / ED DIAGNOSES  Final diagnoses:  Atrial fibrillation with rapid ventricular response (HCC)     MEDICATIONS GIVEN DURING THIS VISIT:  Medications  diltiazem (CARDIZEM) 1 mg/mL load via infusion 15 mg (15 mg Intravenous Bolus from Bag 06/04/19 1624)    And  diltiazem (CARDIZEM) 100 mg in dextrose 5 % 100 mL (1 mg/mL) infusion (10 mg/hr Intravenous Rate/Dose Change 06/04/19 1804)  heparin ADULT infusion 100 units/mL (25000 units/26550mL sodium chloride 0.45%) (1,500 Units/hr Intravenous New Bag/Given 06/04/19 1627)  LORazepam (ATIVAN) injection 1 mg (1 mg Intravenous Given 06/04/19 1607)  heparin bolus via infusion 4,000 Units (4,000 Units Intravenous Bolus from Bag 06/04/19 1629)     ED Discharge Orders    None       Note:  This document was  prepared using Dragon voice recognition software and may include unintentional dictation errors.   Shaune PollackIsaacs, Stephani Janak, MD 06/04/19 [phone]

## 2019-06-04 NOTE — ED Notes (Signed)
Pt 149 bpm on monitor currently.

## 2019-06-05 ENCOUNTER — Inpatient Hospital Stay: Payer: BLUE CROSS/BLUE SHIELD

## 2019-06-05 ENCOUNTER — Inpatient Hospital Stay
Admit: 2019-06-05 | Discharge: 2019-06-05 | Disposition: A | Discharge status: Home / Self Care | Payer: BLUE CROSS/BLUE SHIELD | Attending: Internal Medicine | Admitting: Internal Medicine

## 2019-06-05 LAB — BASIC METABOLIC PANEL
Anion gap: 10 (ref 5–15)
BUN: 12 mg/dL (ref 6–20)
CO2: 24 mmol/L (ref 22–32)
Calcium: 8.8 mg/dL — ABNORMAL LOW (ref 8.9–10.3)
Chloride: 104 mmol/L (ref 98–111)
Creatinine, Ser: 0.78 mg/dL (ref 0.61–1.24)
GFR calc Af Amer: >60 mL/min (ref >60)
GFR calc non Af Amer: >60 mL/min (ref >60)
Glucose, Bld: 120 mg/dL — ABNORMAL HIGH (ref 70–99)
Potassium: 3.6 mmol/L (ref 3.5–5.1)
Sodium: 138 mmol/L (ref 135–145)

## 2019-06-05 LAB — CBC
HCT: 43.2 % (ref 39.0–52.0)
Hemoglobin: 14.9 g/dL (ref 13.0–17.0)
MCH: 30.5 pg (ref 26.0–34.0)
MCHC: 34.5 g/dL (ref 30.0–36.0)
MCV: 88.3 fL (ref 80.0–100.0)
Platelets: 262 10*3/uL (ref 150–400)
RBC: 4.89 MIL/uL (ref 4.22–5.81)
RDW: 13.2 % (ref 11.5–15.5)
WBC: 6 10*3/uL (ref 4.0–10.5)
nRBC: 0 % (ref 0.0–0.2)

## 2019-06-05 LAB — HEMOGLOBIN A1C
Hgb A1c MFr Bld: 5.5 % (ref 4.8–5.6)
Mean Plasma Glucose: 111.15 mg/dL

## 2019-06-05 LAB — LIPID PANEL
Cholesterol: 217 mg/dL — ABNORMAL HIGH (ref 0–200)
HDL: 53 mg/dL (ref >40)
LDL Cholesterol: 144 mg/dL — ABNORMAL HIGH (ref 0–99)
Total CHOL/HDL Ratio: 4.1 RATIO
Triglycerides: 102 mg/dL (ref <150)
VLDL: 20 mg/dL (ref 0–40)

## 2019-06-05 LAB — TSH: TSH: 2.617 u[IU]/mL (ref 0.350–4.500)

## 2019-06-05 LAB — ECHOCARDIOGRAM COMPLETE
Height: 72 in
Weight: 3739 oz

## 2019-06-05 LAB — NM MYOCAR MULTI W/SPECT W/WALL MOTION / EF
Estimated workload: 12.3 METS
Exercise duration (min): 10 min
Exercise duration (sec): 35 s
LV dias vol: 110 mL (ref 62–150)
LV sys vol: 29 mL
MPHR: 165 {beats}/min
Peak HR: 151 {beats}/min
Percent HR: 91 %
Rest HR: 65 {beats}/min
SDS: 0
SRS: 0
SSS: 0
TID: 0.83

## 2019-06-05 LAB — HEPARIN LEVEL (UNFRACTIONATED): Heparin Unfractionated: 0.43 IU/mL (ref 0.30–0.70)

## 2019-06-05 MED ORDER — REGADENOSON 0.4 MG/5ML IV SOLN: 0.4000 mg | Freq: Once | INTRAVENOUS | Status: DC

## 2019-06-05 MED ORDER — TECHNETIUM TC 99M TETROFOSMIN IV KIT
10.0000 | PACK | Freq: Once | INTRAVENOUS | Status: AC | PRN
  Administered 2019-06-05: 10.676 via INTRAVENOUS

## 2019-06-05 MED ORDER — DILTIAZEM HCL ER COATED BEADS 120 MG PO CP24: 120.0000 mg | ORAL_CAPSULE | Freq: Every day | ORAL | 0 refills | Status: AC

## 2019-06-05 MED ORDER — TECHNETIUM TC 99M TETROFOSMIN IV KIT
30.0000 | PACK | Freq: Once | INTRAVENOUS | Status: AC | PRN
  Administered 2019-06-05: 31.397 via INTRAVENOUS

## 2019-06-05 MED ORDER — DILTIAZEM HCL 30 MG PO TABS
30.0000 mg | ORAL_TABLET | Freq: Four times a day (QID) | ORAL | Status: DC
  Administered 2019-06-05 (×2): 30 mg via ORAL
  Filled 2019-06-05 (×2): qty 1

## 2019-06-05 NOTE — Progress Notes (Signed)
*  PRELIMINARY RESULTS* Echocardiogram 2D Echocardiogram has been performed.  Troy Rangel 06/05/2019, 12:02 PM

## 2019-06-05 NOTE — Consult Note (Signed)
Cardiology Consultation Note    Patient ID: TYR FRANCA, MRN: [id number], DOB/AGE: 21-May-1961 58 y.o. Admit date: 06/04/2019   Date of Consult: 06/05/2019 Primary Physician: Patient, No Pcp Per Primary Cardiologist: none  Chief Complaint: afib Reason for Consultation: afib Requesting MD: Dr. Posey Pronto  HPI: Troy Rangel is a 58 y.o. male with history of ethanol abuse and gastric reflux disease who presented to emergency room with complaints of chest discomfort and palpitations for the last week.  They have occurred on and off are more commonly at night.  Not related to exertion.  They appear to get worse after drinking coffee.  EKG showed atrial fibrillation with rapid ventricular response.  Chest x-ray showed no pulmonary edema but a hiatal hernia.  He was placed on diltiazem drip and heparin drip.  Patient ruled out for myocardial infarction with normal high-sensitivity troponins x2.  TSH was normal urine drug screen was positive for cannabinoids potassium was 3.5 on admission 3.6 this morning.  Echocardiogram is pending.  Patient was placed on heparin and a Cardizem drip.  Patient has converted to sinus rhythm.  Patient admits to drinking approximately 12 beers a day.  He has done this for many years.  He is not aware of the last time he went a week without drinking.  He smokes cigars and cigarettes on occasion.  His urine was positive for marijuana.  He has some left-sided chest tightness with his irregular heartbeat.  Unclear whether this is with exertion alone.  Past Medical History:  Diagnosis Date  . GERD (gastroesophageal reflux disease)       Surgical History: History reviewed. No pertinent surgical history.   Home Meds: Prior to Admission medications   Medication Sig Start Date End Date Taking? Authorizing Provider  aspirin EC 81 MG tablet Take 81 mg by mouth daily.    [provider]  tadalafil (CIALIS) 20 MG tablet Take 20 mg by mouth daily as needed.       [provider]    Inpatient Medications:  . aspirin EC  81 mg Oral Daily  . folic acid  1 mg Oral Daily  . multivitamin with minerals  1 tablet Oral Daily  . thiamine  100 mg Oral Daily   Or  . thiamine  100 mg Intravenous Daily   . diltiazem (CARDIZEM) infusion Stopped (06/05/19 0021)  . heparin 1,500 Units/hr (06/05/19 0109)    Allergies: No Known Allergies  Social History   Socioeconomic History  . Marital status: Single    Spouse name: Not on file  . Number of children: Not on file  . Years of education: Not on file  . Highest education level: Not on file  Occupational History  . Not on file  Social Needs  . Financial resource strain: Not on file  . Food insecurity    Worry: Not on file    Inability: Not on file  . Transportation needs    Medical: Not on file    Non-medical: Not on file  Tobacco Use  . Smoking status: Former Research scientist (life sciences)  . Smokeless tobacco: Never Used  Substance and Sexual Activity  . Alcohol use: Yes  . Drug use: No  . Sexual activity: Not on file  Lifestyle  . Physical activity    Days per week: Not on file    Minutes per session: Not on file  . Stress: Not on file  Relationships  . Social Herbalist on phone: Not  on file    Gets together: Not on file    Attends religious service: Not on file    Active member of club or organization: Not on file    Attends meetings of clubs or organizations: Not on file    Relationship status: Not on file  . Intimate partner violence    Fear of current or ex partner: Not on file    Emotionally abused: Not on file    Physically abused: Not on file    Forced sexual activity: Not on file  Other Topics Concern  . Not on file  Social History Narrative  . Not on file     History reviewed. No pertinent family history.   Review of Systems: A 12-system review of systems was performed and is negative except as noted in the HPI.  Labs: No results for input(s): CKTOTAL, CKMB, TROPONINI  in the last 72 hours. Lab Results  Component Value Date   WBC 6.0 06/05/2019   HGB 14.9 06/05/2019   HCT 43.2 06/05/2019   MCV 88.3 06/05/2019   PLT 262 06/05/2019    Recent Labs  Lab 06/04/19 1509 06/05/19 0512  NA 136 138  K 3.5 3.6  CL 102 104  CO2 22 24  BUN 15 12  CREATININE 0.70 0.78  CALCIUM 8.7* 8.8*  PROT 7.3  --   BILITOT 0.9  --   ALKPHOS 65  --   ALT 27  --   AST 24  --   GLUCOSE 105* 120*   Lab Results  Component Value Date   CHOL 217 (H) 06/05/2019   HDL 53 06/05/2019   LDLCALC 144 (H) 06/05/2019   TRIG 102 06/05/2019   No results found for: DDIMER  Radiology/Studies:  Dg Chest Portable 1 View  Result Date: 06/04/2019 CLINICAL DATA:  Shortness of breath EXAM: PORTABLE CHEST 1 VIEW COMPARISON:  None. FINDINGS: Moderate-sized hiatal hernia. Heart is normal size. No confluent opacities or effusions. No acute bony abnormality. IMPRESSION: No acute cardiopulmonary disease. Moderate-sized hiatal hernia. Electronically Signed   By: Charlett NoseKevin  Dover M.D.   On: 06/04/2019 16:41    Wt Readings from Last 3 Encounters:  06/05/19 106 kg  05/15/11 106.6 kg    EKG: Initially atrial fibrillation with rapid ventricular response.  Currently in sinus rhythm.  Physical Exam:  Blood pressure 107/72, pulse (!) 55, temperature 97.8 F (36.6 C), temperature source Oral, resp. rate 18, height 6' (1.829 m), weight 106 kg, SpO2 100 %. Body mass index is 31.69 kg/m. General: Well developed, well nourished, in no acute distress. Head: Normocephalic, atraumatic, sclera non-icteric, no xanthomas, nares are without discharge.  Neck: Negative for carotid bruits. JVD not elevated. Lungs: Clear bilaterally to auscultation without wheezes, rales, or rhonchi. Breathing is unlabored. Heart: RRR with S1 S2. No murmurs, rubs, or gallops appreciated. Abdomen: Soft, non-tender, non-distended with normoactive bowel sounds. No hepatomegaly. No rebound/guarding. No obvious abdominal  masses. Msk:  Strength and tone appear normal for age. Extremities: No clubbing or cyanosis. No edema.  Distal pedal pulses are 2+ and equal bilaterally. Neuro: Alert and oriented X 3. No facial asymmetry. No focal deficit. Moves all extremities spontaneously. Psych:  Responds to questions appropriately with a normal affect.     Assessment and Plan  58 year old male with history of ethanol abuse and reflux GI disease admitted with A. fib with rapid ventricular response.  Converted to sinus rhythm with IV Cardizem.  Was placed on heparin.  Chads 2 Vasc score is  0.  He denies diabetes hypertension previous stroke vascular disease.  Etiology of his atrial fibrillation is likely ethanol abuse.  We will proceed with echocardiogram which is been ordered to evaluate LV function.  We will also proceed with a functional study with ETT to evaluate for exercize induced arrythmia or ischemia Will discontinue heparin and placed on p.o. Cardizem.  Counseled on alcohol cessation.  If echo is unremarkable and functional study negative, would consider discharge today on p.o. Cardizem at 120 mg daily and defer anticoagulation for now.  Signed, Dalia HeadingKenneth A Brigg Cape MD 06/05/2019, 6:57 AM Pager: [phone]

## 2019-06-05 NOTE — Progress Notes (Signed)
Ch visit pt to provide AD education. Pt did desire to have an AD completed but ch informed pt of the importance of declaring a HPOA with his family present but also shared that the AD could not be completed in the hospital due to limitations of having witnesses in the pt room. Pt shared that his father had heart issues and died at a young age. Pt was concern of having the same issues as his father. Ch provided a compassionate presence and shared why it was important for him to have an HPOA assigned considering his family history. Pt understood but was appreciative of the education related to the AD. No further needs at this time.    06/05/19 1400  Clinical Encounter Type  Visited With Patient and family together  Visit Type Other (Comment);Social support (AD education )  Referral From Physician  Consult/Referral To Chaplain  Stress Factors  Patient Stress Factors Health changes  Family Stress Factors None identified  Advance Directives (For Healthcare)  Would patient like information on creating a medical advance directive? Yes (Inpatient - patient defers creating a medical advance directive at this time - Information given)

## 2019-06-05 NOTE — Progress Notes (Addendum)
Troy Rangel at [facility] was admitted to the Hospital on 06/04/2019 and Discharged  06/05/2019 and should be excused from work/school   for 4 days starting 06/04/2019 , may return to work/school without any restrictions.  Call Dustin Flock MD with questions.  Dustin Flock M.D on 06/05/2019,at 2:23 PM  [place] at [facility]  [phone]

## 2019-06-05 NOTE — Progress Notes (Signed)
Pts heart rate dropped down to 52. MD made aware and the cardiac drip was stopped.

## 2019-06-05 NOTE — Plan of Care (Signed)
  Problem: Education: Goal: Knowledge of General Education information will improve Description: Including pain rating scale, medication(s)/side effects and non-pharmacologic comfort measures Outcome: Progressing   Problem: Cardiac: Goal: Ability to achieve and maintain adequate cardiopulmonary perfusion will improve Outcome: Progressing   

## 2019-06-05 NOTE — Progress Notes (Signed)
Discharge instructions explained to pt/ verbalized an understanding/ ivs and tele removed/ work note given/ will transport off unit when ride arrives

## 2019-06-05 NOTE — Discharge Summary (Signed)
[employer] - [place] at [facility], 58 y.o., DOB [birth year], MRN [id number]. Admission date: 06/04/2019 Discharge Date 06/05/2019 Primary MD Patient, No Pcp Per Admitting Physician Sela Hua, MD  Admission Diagnosis  Atrial fibrillation with rapid ventricular response [facility]) [I48.91]  Discharge Diagnosis   Active Problems:   Atrial fibrillation with RVR [facility]) Chest pain Alcohol abuse       Hospital Course Troy Rangel  is a 58 y.o. male with a known history of GERD and alcohol use who presented to the ED with palpitations and chest discomfort for the last week.  His symptoms have been occurring off and on, more commonly at night.  His symptoms are not related to exertion.  Patient noted to have A. fib with RVR.  He spontaneously converted after Cardizem.  He was seen by cardiology underwent a stress test which was normal.  His echo showed normal EF.  Patient strongly recommended to stop drinking.  He will follow-up with cardiology.  Now he started on Cardizem.  His [place] Vascor was 0 therefore he will only be treated with aspirin.             Consults  cardiology  Significant Tests:  See full reports for all details     Nm Myocar Multi W/spect W/wall Motion / Ef  Result Date: 06/05/2019  Blood pressure demonstrated a normal response to exercise.  The study is normal.  This is a low risk study.  The left ventricular ejection fraction is mildly decreased (45-54%).    Dg Chest Portable 1 View  Result Date: 06/04/2019 CLINICAL DATA:  Shortness of breath EXAM: PORTABLE CHEST 1 VIEW COMPARISON:  None. FINDINGS: Moderate-sized hiatal hernia. Heart is normal size. No confluent opacities or effusions. No acute bony abnormality. IMPRESSION: No acute cardiopulmonary disease. Moderate-sized hiatal hernia. Electronically Signed   By: Rolm Baptise M.D.   On: 06/04/2019 16:41       Today   Subjective:   [facility] patient doing well  denies any complaints o Objective:   Blood pressure 117/72, pulse 64, temperature 98.3 F (36.8 C), temperature source Oral, resp. rate 18, height 6' (1.829 m), weight 106 kg, SpO2 99 %.  .  Intake/Output Summary (Last 24 hours) at 06/05/2019 1537 Last data filed at 06/05/2019 1339 Gross per 24 hour  Intake 331.77 ml  Output 400 ml  Net -68.23 ml    Exam VITAL SIGNS: Blood pressure 117/72, pulse 64, temperature 98.3 F (36.8 C), temperature source Oral, resp. rate 18, height 6' (1.829 m), weight 106 kg, SpO2 99 %.  GENERAL:  58 y.o.-year-old patient lying in the bed with no acute distress.  EYES: Pupils equal, round, reactive to light and accommodation. No scleral icterus. Extraocular muscles intact.  HEENT: Head atraumatic, normocephalic. Oropharynx and nasopharynx clear.  NECK:  Supple, no jugular venous distention. No thyroid enlargement, no tenderness.  LUNGS: Normal breath sounds bilaterally, no wheezing, rales,rhonchi or crepitation. No use of accessory muscles of respiration.  CARDIOVASCULAR: S1, S2 normal. No murmurs, rubs, or gallops.  ABDOMEN: Soft, nontender, nondistended. Bowel sounds present. No organomegaly or mass.  EXTREMITIES: No pedal edema, cyanosis, or clubbing.  NEUROLOGIC: Cranial nerves II through XII are intact. Muscle strength 5/5 in all extremities. Sensation intact. Gait not checked.  PSYCHIATRIC: The patient is alert and oriented x 3.  SKIN: No obvious rash, lesion, or ulcer.   Data Review     CBC w Diff:  Lab Results  Component  Value Date   WBC 6.0 06/05/2019   HGB 14.9 06/05/2019   HGB 14.5 06/10/2012   HCT 43.2 06/05/2019   HCT 41.5 06/10/2012   PLT 262 06/05/2019   PLT 235 06/10/2012   LYMPHOPCT 31 06/04/2019   MONOPCT 9 06/04/2019   EOSPCT 1 06/04/2019   BASOPCT 1 06/04/2019   CMP:  Lab Results  Component Value Date   NA 138 06/05/2019   NA 141 06/10/2012   K 3.6 06/05/2019   K 3.7 06/10/2012   CL 104 06/05/2019   CL 104  06/10/2012   CO2 24 06/05/2019   CO2 30 06/10/2012   BUN 12 06/05/2019   BUN 18 06/10/2012   CREATININE 0.78 06/05/2019   CREATININE 1.12 06/10/2012   PROT 7.3 06/04/2019   ALBUMIN 3.9 06/04/2019   BILITOT 0.9 06/04/2019   ALKPHOS 65 06/04/2019   AST 24 06/04/2019   ALT 27 06/04/2019  .  Micro Results Recent Results (from the past 240 hour(s))  SARS CORONAVIRUS 2 Nasal Swab Aptima Multi Swab     Status: None   Collection Time: 06/04/19  4:55 PM   Specimen: Aptima Multi Swab; Nasal Swab  Result Value Ref Range Status   SARS Coronavirus 2 NEGATIVE NEGATIVE Final    Comment: (NOTE) SARS-CoV-2 target nucleic acids are NOT DETECTED. The SARS-CoV-2 RNA is generally detectable in upper and lower respiratory specimens during the acute phase of infection. Negative results do not preclude SARS-CoV-2 infection, do not rule out co-infections with other pathogens, and should not be used as the sole basis for treatment or other patient management decisions. Negative results must be combined with clinical observations, patient history, and epidemiological information. The expected result is Negative. Fact Sheet for Patients: HairSlick.nohttps://www.fda.gov/media/138098/download Fact Sheet for Healthcare Providers: quierodirigir.comhttps://www.fda.gov/media/138095/download This test is not yet approved or cleared by the [place] FDA and  has been authorized for detection and/or diagnosis of SARS-CoV-2 by FDA under an Emergency Use Authorization (EUA). This EUA will remain  in effect (meaning this test can be used) for the duration of the COVID-19 declaration under Section 56 4(b)(1) of the Act, 21 U.S.C. section 360bbb-3(b)(1), unless the authorization is terminated or revoked sooner. Performed at [facility], [address] [place], [place] [address]         Code Status Orders  (From admission, onward)         Start     Ordered   06/04/19 2011  Full code  Continuous     06/04/19 2011         Code Status History    This patient has a current code status but no historical code status.   Advance Care Planning Activity          Follow-up Information    Dalia HeadingFath, Kenneth A, MD Follow up in 2 week(s).   Specialty: Cardiology Why: hosp f/u Contact information: [address] [place] [address] [phone]        Go on 06/14/2019 to follow up.   Why: appointment at 9am          Discharge Medications   Allergies as of 06/05/2019   No Known Allergies     Medication List    TAKE these medications   aspirin EC 81 MG tablet Take 81 mg by mouth daily.   diltiazem 120 MG 24 hr capsule Commonly known as: Cardizem CD Take 1 capsule (120 mg total) by mouth daily.   tadalafil 20 MG tablet Commonly  known as: CIALIS Take 20 mg by mouth daily as needed.          Total Time in preparing paper work, data evaluation and todays exam - 35 minutes  Auburn BilberryShreyang Tauni Sanks M.D on 06/05/2019 at 3:37 PM [employer]   Office  [phone]

## 2019-06-05 NOTE — Consult Note (Signed)
ANTICOAGULATION CONSULT NOTE   Pharmacy Consult for Heparin dosing  Indication: atrial fibrillation  No Known Allergies  Patient Measurements: Height: 6' (182.9 cm) Weight: 235 lb (106.6 kg) IBW/kg (Calculated) : 77.6 Heparin Dosing Weight: 100.6 kg   Vital Signs: Temp: 97.8 F (36.6 C) (08/24 0436) Temp Source: Oral (08/24 0436) BP: 107/72 (08/24 0436) Pulse Rate: 55 (08/24 0436)  Labs: Recent Labs    06/04/19 1509 06/04/19 2052 06/04/19 2233 06/05/19 0512  HGB 14.4  --   --  14.9  HCT 42.1  --   --  43.2  PLT 274  --   --  262  APTT 26  --   --   --   LABPROT 12.5  --   --   --   INR 0.9  --   --   --   HEPARINUNFRC  --   --  0.39 0.43  CREATININE 0.70  --   --   --   TROPONINIHS 6 7  --   --     Estimated Creatinine Clearance: 131.6 mL/min (by C-G formula based on SCr of 0.7 mg/dL).   Medications:  Medication reconciliation incomplete at this time. Confirmed with patient he has not had anticoagulants PTA.    0823 @ 2233 HL = 0.39, therapeutic x 1 0824 @ 0512 HL = 0.43, therapeutic x 2  Assessment: Pharmacy was consulted for heparin dosing for atrial fibrillation. Heparin level is therapeutic.   Goal of Therapy:  Heparin level 0.3-0.7 units/ml Monitor platelets by anticoagulation protocol: Yes   Plan:  Heparin DW: 100.6 kg Continue heparin infusion at 1500 units/hr Check anti-Xa level and CBC daily while on heparin, per protocol Continue to monitor H&H and platelets  Thank you for allowing pharmacy to be a part of this patient's care.  [place] Crane, Jocilynn Grade A 06/05/2019,5:48 AM

## 2019-06-06 LAB — HIV ANTIBODY (ROUTINE TESTING W REFLEX): HIV Screen 4th Generation wRfx: NONREACTIVE

## 2019-08-02 ENCOUNTER — Encounter: Payer: Self-pay | Admitting: Emergency Medicine

## 2019-08-02 ENCOUNTER — Emergency Department
Admission: EM | Admit: 2019-08-02 | Discharge: 2019-08-02 | Disposition: A | Discharge status: Home / Self Care | Payer: BLUE CROSS/BLUE SHIELD | Attending: Emergency Medicine | Admitting: Emergency Medicine

## 2019-08-02 ENCOUNTER — Emergency Department: Payer: BLUE CROSS/BLUE SHIELD

## 2019-08-02 DIAGNOSIS — R0600 Dyspnea, unspecified: Secondary | ICD-10-CM | POA: Diagnosis not present

## 2019-08-02 DIAGNOSIS — Z79899 Other long term (current) drug therapy: Secondary | ICD-10-CM | POA: Diagnosis not present

## 2019-08-02 DIAGNOSIS — I4891 Unspecified atrial fibrillation: Secondary | ICD-10-CM | POA: Insufficient documentation

## 2019-08-02 DIAGNOSIS — R002 Palpitations: Secondary | ICD-10-CM | POA: Diagnosis present

## 2019-08-02 DIAGNOSIS — Z7982 Long term (current) use of aspirin: Secondary | ICD-10-CM | POA: Diagnosis not present

## 2019-08-02 DIAGNOSIS — Z87891 Personal history of nicotine dependence: Secondary | ICD-10-CM | POA: Diagnosis not present

## 2019-08-02 LAB — CBC WITH DIFFERENTIAL/PLATELET
Abs Immature Granulocytes: 0.02 10*3/uL (ref 0.00–0.07)
Basophils Absolute: 0.1 10*3/uL (ref 0.0–0.1)
Basophils Relative: 1 %
Eosinophils Absolute: 0.1 10*3/uL (ref 0.0–0.5)
Eosinophils Relative: 1 %
HCT: 42.4 % (ref 39.0–52.0)
Hemoglobin: 14.3 g/dL (ref 13.0–17.0)
Immature Granulocytes: 0 %
Lymphocytes Relative: 24 %
Lymphs Abs: 1.3 10*3/uL (ref 0.7–4.0)
MCH: 30 pg (ref 26.0–34.0)
MCHC: 33.7 g/dL (ref 30.0–36.0)
MCV: 89.1 fL (ref 80.0–100.0)
Monocytes Absolute: 0.4 10*3/uL (ref 0.1–1.0)
Monocytes Relative: 7 %
Neutro Abs: 3.6 10*3/uL (ref 1.7–7.7)
Neutrophils Relative %: 67 %
Platelets: 256 10*3/uL (ref 150–400)
RBC: 4.76 MIL/uL (ref 4.22–5.81)
RDW: 12.3 % (ref 11.5–15.5)
WBC: 5.5 10*3/uL (ref 4.0–10.5)
nRBC: 0 % (ref 0.0–0.2)

## 2019-08-02 LAB — BASIC METABOLIC PANEL
Anion gap: 13 (ref 5–15)
BUN: 14 mg/dL (ref 6–20)
CO2: 23 mmol/L (ref 22–32)
Calcium: 8.8 mg/dL — ABNORMAL LOW (ref 8.9–10.3)
Chloride: 103 mmol/L (ref 98–111)
Creatinine, Ser: 0.92 mg/dL (ref 0.61–1.24)
GFR calc Af Amer: >60 mL/min (ref >60)
GFR calc non Af Amer: >60 mL/min (ref >60)
Glucose, Bld: 123 mg/dL — ABNORMAL HIGH (ref 70–99)
Potassium: 3.5 mmol/L (ref 3.5–5.1)
Sodium: 139 mmol/L (ref 135–145)

## 2019-08-02 LAB — TROPONIN I (HIGH SENSITIVITY): Troponin I (High Sensitivity): 4 ng/L (ref <18)

## 2019-08-02 MED ORDER — DILTIAZEM HCL ER COATED BEADS 120 MG PO CP24
120.0000 mg | ORAL_CAPSULE | Freq: Once | ORAL | Status: AC
  Administered 2019-08-02: 17:00:00 120 mg via ORAL
  Filled 2019-08-02: qty 1

## 2019-08-02 MED ORDER — DILTIAZEM HCL ER COATED BEADS 240 MG PO TB24: 240.0000 mg | ORAL_TABLET | Freq: Every day | ORAL | 1 refills | Status: AC

## 2019-08-02 NOTE — Discharge Instructions (Signed)
Your heart rhythm today was consistent with atrial fibrillation with a rapid heart rate.  After receiving medicine in the ambulance, your heart rate was much improved.  Cardiology recommends that we increase the medicine that controls your heart rate.  You may take your current dose of diltiazem twice a day until you run out with your current pills.  You may then fill the prescription you were given today and take that once daily.  Please schedule follow-up with cardiology within the next week and return to the ER for new or worsening symptoms.

## 2019-08-02 NOTE — ED Notes (Signed)
Patient transported to X-ray 

## 2019-08-02 NOTE — ED Notes (Signed)
Pt verbalized understanding of discharge instructions. NAD at this time. 

## 2019-08-02 NOTE — ED Provider Notes (Signed)
[facility] Emergency Department Provider Note   ____________________________________________   First MD Initiated Contact with Patient 08/02/19 1442     (approximate)  I have reviewed the triage vital signs and the nursing notes.   HISTORY  Chief Complaint Tachycardia    HPI Troy Rangel is a 58 y.o. male with past medical history of atrial fibrillation and alcohol abuse who presents to the ED complaining of palpitations.  Patient reports he was feeling fine when he woke up this morning, but once he arrived to work he felt like his heart was racing and skipping beats.  He states this made it feel like it was difficult for him to catch his breath, but he did not have any associated chest pain.  He has been feeling well recently with no fevers, cough, pain or swelling in his legs.  He states he has been compliant with his diltiazem and was told he does not need to take anticoagulation.  He has had issues with alcohol abuse in the past, but states he has been able to cut down to about 2 drinks per day.  EMS states that his heart rate was varying from 110-190 and gave 2.5 mg metoprolol with improvement.        Past Medical History:  Diagnosis Date  . GERD (gastroesophageal reflux disease)     Patient Active Problem List   Diagnosis Date Noted  . Atrial fibrillation with RVR (HCC) 06/04/2019  . Kidney infection 05/15/2011  . Prostatitis, acute 05/15/2011  . Hematuria 05/15/2011  . Lumbar radiculitis 05/15/2011  . HYPOGONADISM 04/01/2010  . GERD 06/27/2009  . 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 No pertinent surgical history.  Prior to Admission medications   Medication Sig Start Date End Date Taking? Authorizing Provider  aspirin EC 81 MG tablet Take 81 mg by mouth daily.   Yes [provider]  busPIRone (BUSPAR) 5 MG tablet Take 5 mg by mouth 2 (two)  times daily. 07/12/19 07/11/20 Yes [provider]  diltiazem (CARDIZEM CD) 120 MG 24 hr capsule Take 1 capsule (120 mg total) by mouth daily. 06/05/19  Yes Auburn Bilberry, MD  diltiazem (CARDIZEM LA) 240 MG 24 hr tablet Take 1 tablet (240 mg total) by mouth daily. 08/02/19 10/01/19  Chesley Noon, MD  tadalafil (CIALIS) 20 MG tablet Take 20 mg by mouth daily as needed.      [provider]    Allergies Patient has no known allergies.  History reviewed. No pertinent family history.  Social History Social History   Tobacco Use  . Smoking status: Former Games developer  . Smokeless tobacco: Never Used  Substance Use Topics  . Alcohol use: Yes  . Drug use: No    Review of Systems  Constitutional: No fever/chills Eyes: No visual changes. ENT: No sore throat. Cardiovascular: Denies chest pain.  Positive for palpitations. Respiratory: Positive for shortness of breath. Gastrointestinal: No abdominal pain.  No nausea, no vomiting.  No diarrhea.  No constipation. Genitourinary: Negative for dysuria. Musculoskeletal: Negative for back pain. Skin: Negative for rash. Neurological: Negative for headaches, focal weakness or numbness.  ____________________________________________   PHYSICAL EXAM:  VITAL SIGNS: ED Triage Vitals  Enc Vitals Group     BP      Pulse      Resp      Temp      Temp src  SpO2      Weight      Height      Head Circumference      Peak Flow      Pain Score      Pain Loc      Pain Edu?      Excl. in [place]?     Constitutional: Alert and oriented. Eyes: Conjunctivae are normal. Head: Atraumatic. Nose: No congestion/rhinnorhea. Mouth/Throat: Mucous membranes are moist. Neck: Normal ROM Cardiovascular: Normal rate, irregularly irregular rhythm. Grossly normal heart sounds. Respiratory: Normal respiratory effort.  No retractions. Lungs CTAB. Gastrointestinal: Soft and nontender. No distention. Genitourinary: deferred Musculoskeletal: No  lower extremity tenderness nor edema. Neurologic:  Normal speech and language. No gross focal neurologic deficits are appreciated. Skin:  Skin is warm, dry and intact. No rash noted. Psychiatric: Mood and affect are normal. Speech and behavior are normal.  ____________________________________________   LABS (all labs ordered are listed, but only abnormal results are displayed)  Labs Reviewed  BASIC METABOLIC PANEL - Abnormal; Notable for the following components:      Result Value   Glucose, Bld 123 (*)    Calcium 8.8 (*)    All other components within normal limits  CBC WITH DIFFERENTIAL/PLATELET  TROPONIN I (HIGH SENSITIVITY)  TROPONIN I (HIGH SENSITIVITY)   ____________________________________________  EKG  ED ECG REPORT I, Blake Divine, the attending physician, personally viewed and interpreted this ECG.   Date: 08/02/2019  EKG Time: 14:45  Rate: 104  Rhythm: atrial fibrillation, rate 104  Axis: None  Intervals:right bundle branch block  ST&T Change: None    PROCEDURES  Procedure(s) performed (including Critical Care):  Procedures   ____________________________________________   INITIAL IMPRESSION / ASSESSMENT AND PLAN / ED COURSE       58 year old male with history of atrial fibrillation presents to the ED with complaints of palpitations and shortness of breath since earlier today.  EMS noted patient to be in atrial fibrillation and gave 2.5 mg of metoprolol with improved control of rate, now hovering around 100 here in the ED. He reports feeling much better with improved rate control, blood pressure is stable and there are no signs to suggest ACS or acute heart failure.  Will check labs including troponin, plan to touch base with cardiology for any medication recommendations.  Patient's labs are unremarkable, troponin within normal limits.  He continues to feel well.  Case was discussed with Dr. Saralyn Pilar of cardiology, who recommends increasing his  diltiazem dose to 240 mg daily with plan to take current dose twice daily until he he needs to fill prescription of 240 mg tablets.  Counseled patient to follow-up closely with cardiology and return to the ER for worsening symptoms.  Patient agrees with plan.      ____________________________________________   FINAL CLINICAL IMPRESSION(S) / ED DIAGNOSES  Final diagnoses:  Atrial fibrillation with RVR ([place])  Palpitations     ED Discharge Orders         Ordered    diltiazem (CARDIZEM LA) 240 MG 24 hr tablet  Daily     08/02/19 1630           Note:  This document was prepared using Dragon voice recognition software and may include unintentional dictation errors.   Blake Divine, MD 08/02/19 [phone]

## 2019-08-02 NOTE — ED Triage Notes (Signed)
Pt to ED from work by EMS with c/o of feeling anxious and having chest pressure. Pt dx with afib in August. Upon arrival pt's HR 97-106.

## 2020-05-13 IMAGING — CR DG CHEST 2V
2 series · 2 of 2 positions shown · non-contrast
Comparison: Chest x-ray 06/04/2019.

CLINICAL DATA: 58-year-old male with history of chest pain.

EXAM:
CHEST - 2 VIEW

[chest pa]
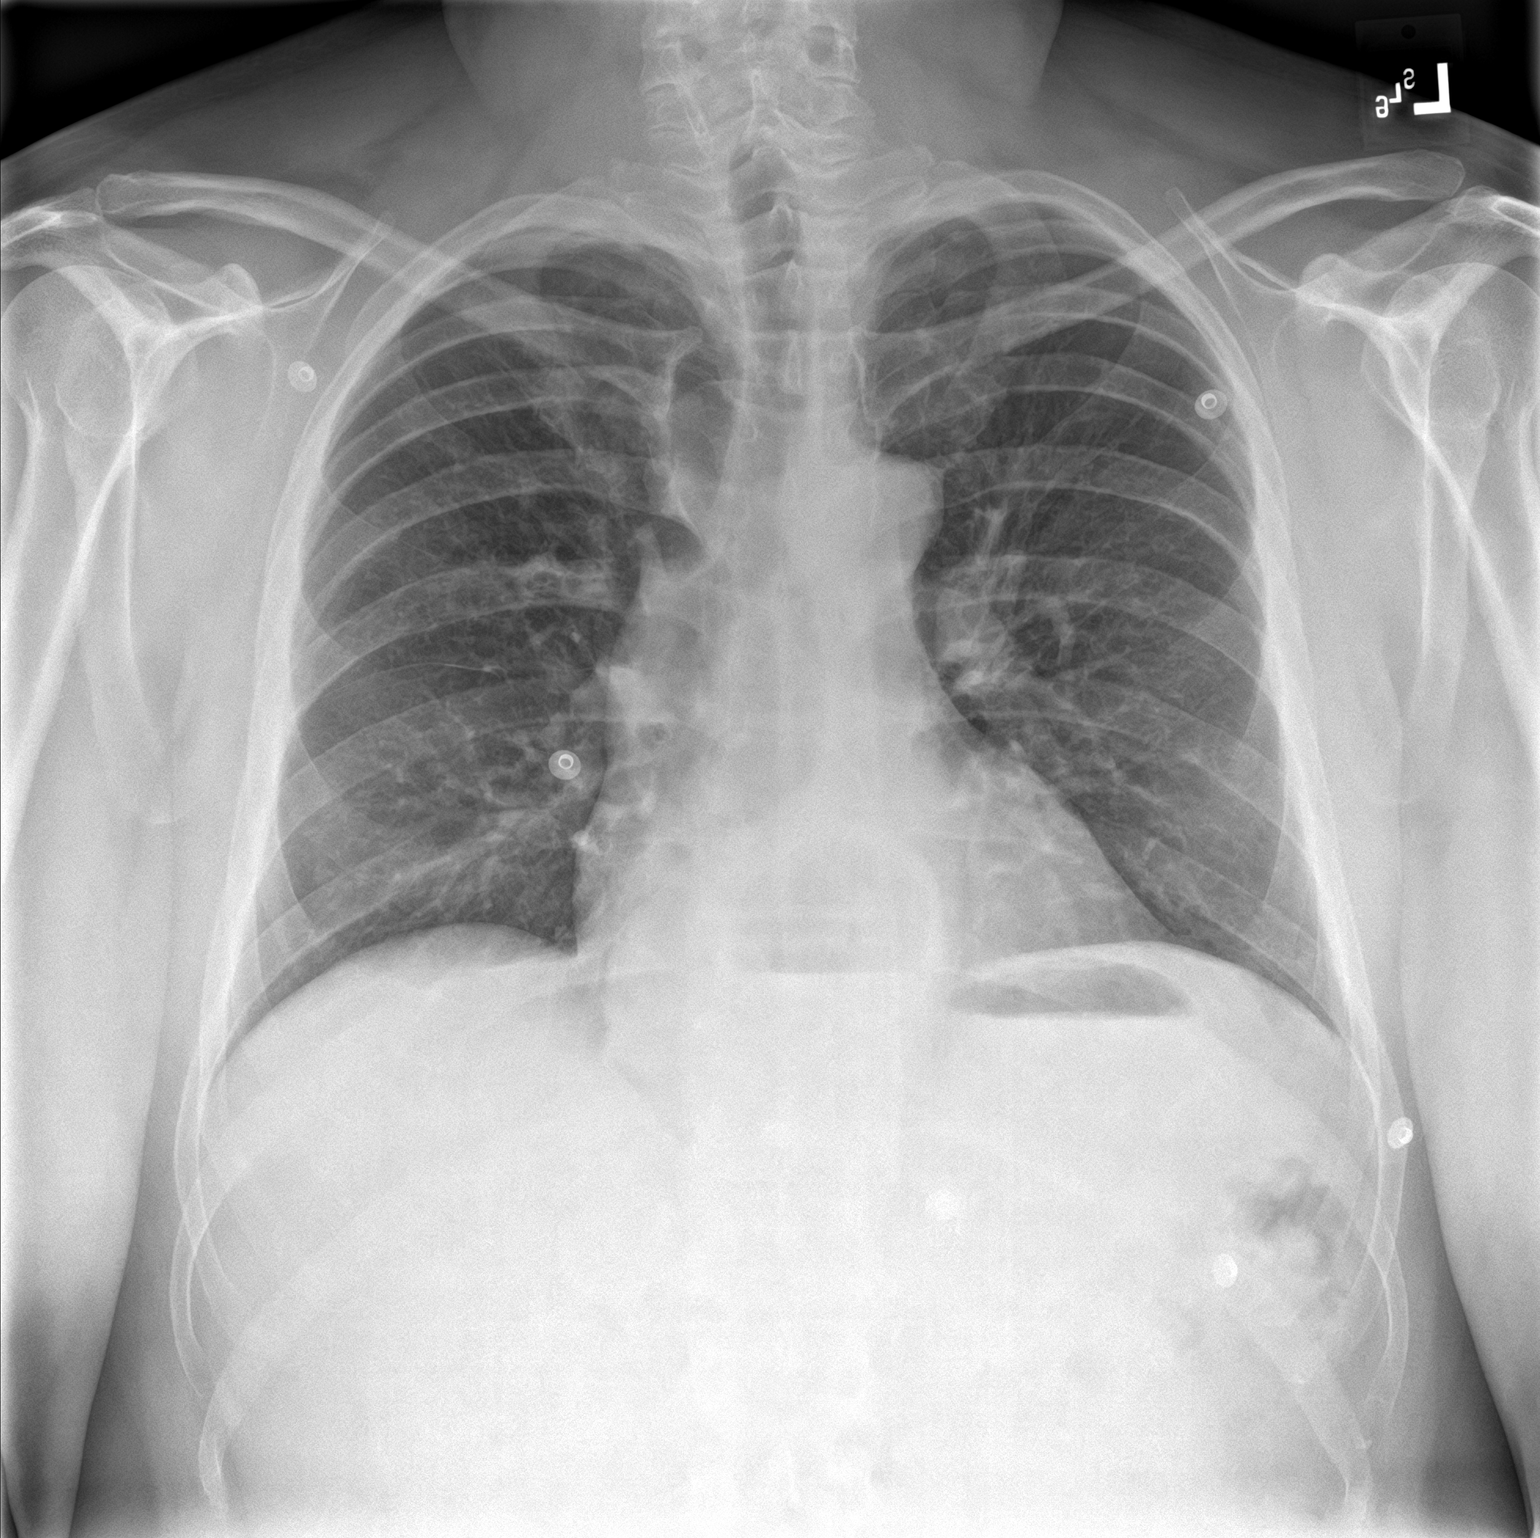

[chest lat]
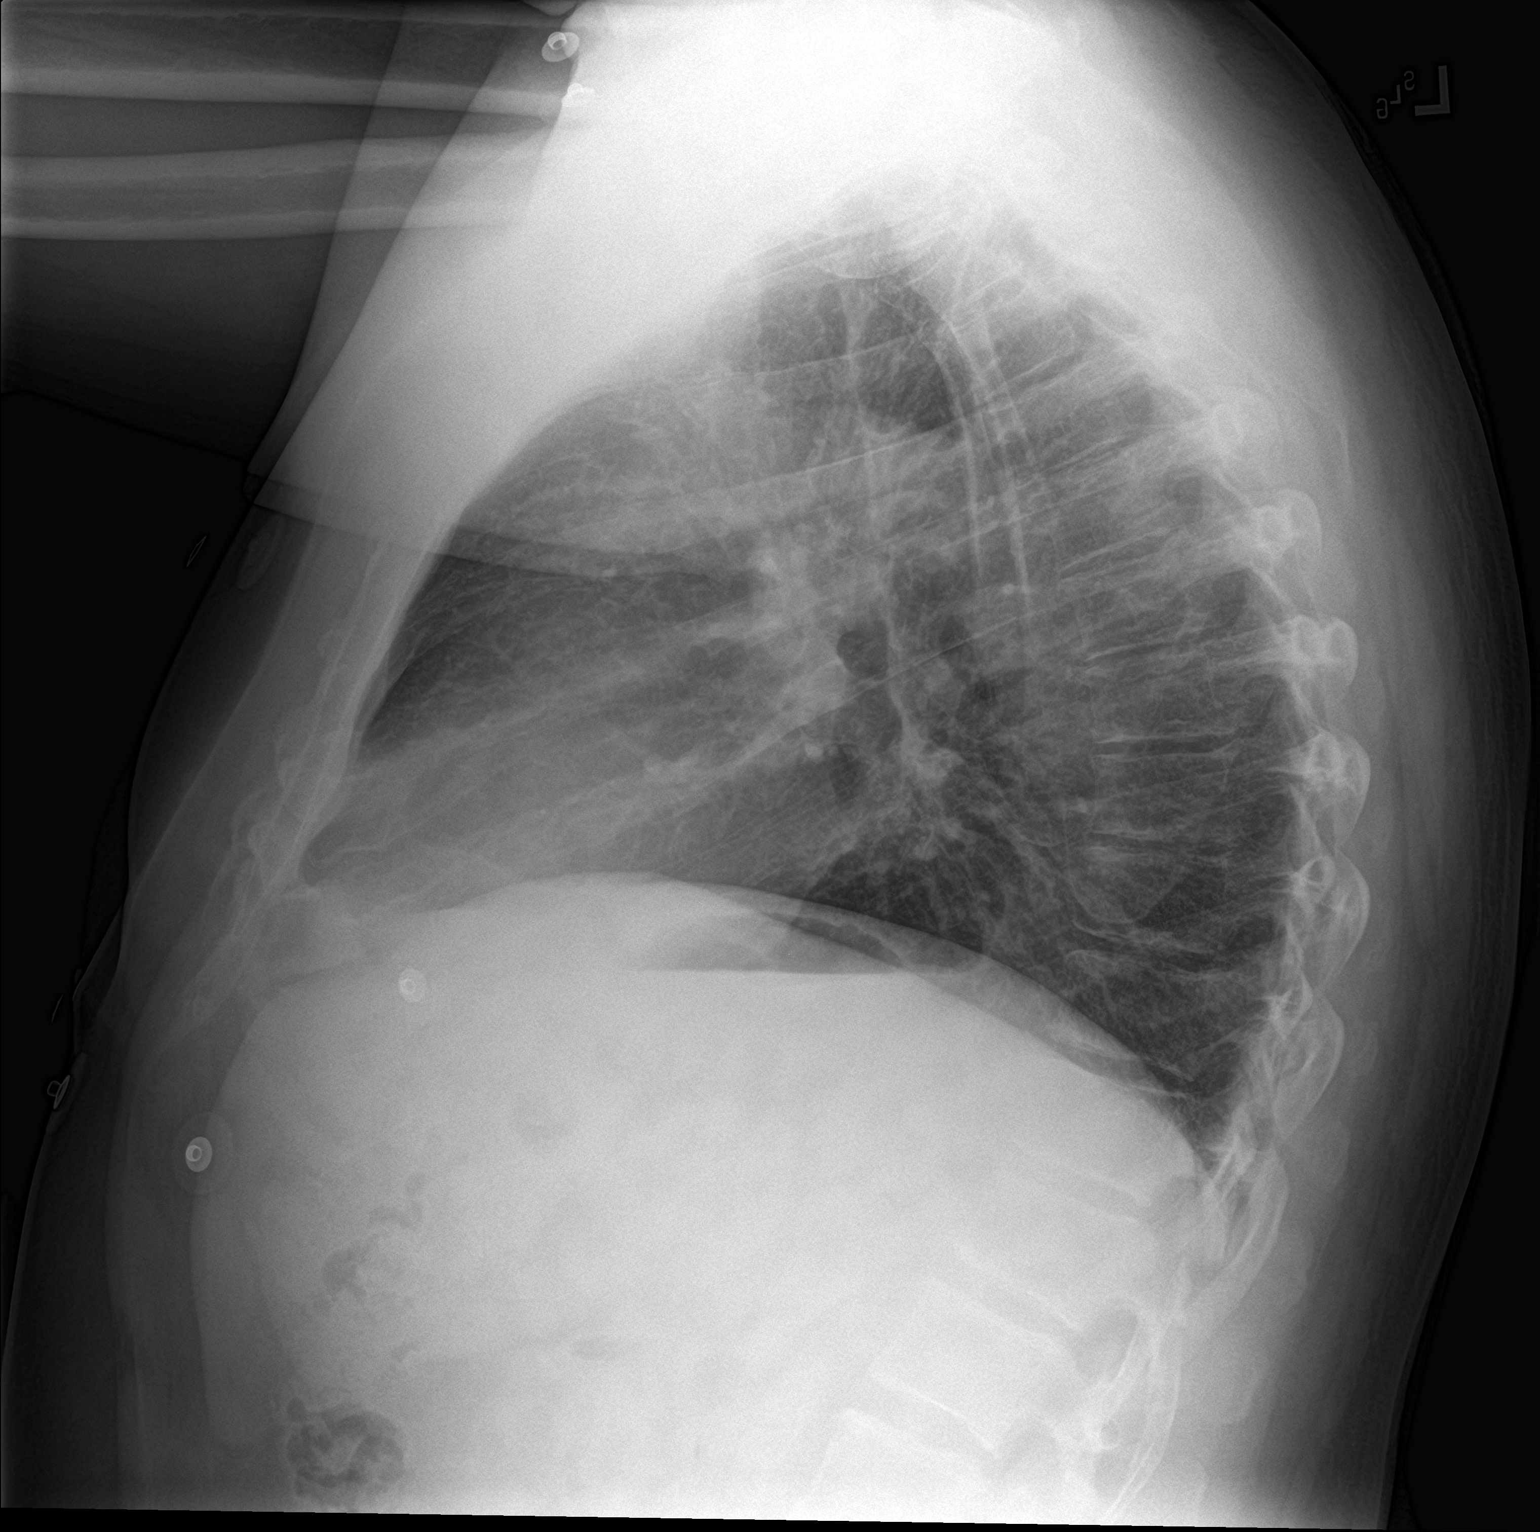

[2 of 2 positions shown; findings below may reference images not displayed]

FINDINGS: Lung volumes are low. No consolidative airspace disease. No pleural
effusions. No pneumothorax. No pulmonary nodule or mass noted.
Moderate-sized hiatal hernia. Pulmonary vasculature and the
cardiomediastinal silhouette are otherwise within normal limits.
IMPRESSION: 1. Low lung volumes without radiographic evidence of acute
cardiopulmonary disease.
2. Moderate-sized hiatal hernia.

## 2021-11-07 ENCOUNTER — Other Ambulatory Visit: Payer: Self-pay | Admitting: Internal Medicine

## 2021-11-07 DIAGNOSIS — I2510 Atherosclerotic heart disease of native coronary artery without angina pectoris: Secondary | ICD-10-CM

## 2021-11-11 ENCOUNTER — Ambulatory Visit
Admission: RE | Admit: 2021-11-11 | Discharge: 2021-11-11 | Disposition: A | Discharge status: Home / Self Care | Payer: Commercial Indemnity | Source: Ambulatory Visit | Attending: Internal Medicine | Admitting: Internal Medicine

## 2021-11-11 ENCOUNTER — Other Ambulatory Visit: Payer: Self-pay

## 2021-11-11 DIAGNOSIS — I2510 Atherosclerotic heart disease of native coronary artery without angina pectoris: Secondary | ICD-10-CM | POA: Insufficient documentation

## 2022-08-23 IMAGING — CT CT CARDIAC CORONARY ARTERY CALCIUM SCORE
3 series · 14 of 20 positions shown, 16 images · non-contrast
Comparison: None.
COMPARISON: None.

Addendum:
EXAM:
OVER-READ INTERPRETATION  CT CHEST

The following report is an over-read performed by radiologist Dr.
Barkati Hine [REDACTED] on 11/11/2021. This
over-read does not include interpretation of cardiac or coronary
anatomy or pathology. The coronary calcium score interpretation by
the cardiologist is attached.
CLINICAL DATA: Risk stratification
Coronary Calcium Score
TECHNIQUE: The patient was scanned on a Siemens Somatom go.Top Scanner. Axial
non-contrast 3 mm slices were carried out through the heart. The
data set was analyzed on a dedicated work station and scored using
the Agatson method.

[Series 2: sa36 calcium scoring 3.00 · axial · 0.43mm/px · z∈[-1118,-1019]mm · 4 of 57 slices shown]
[im 12/57  vessel]
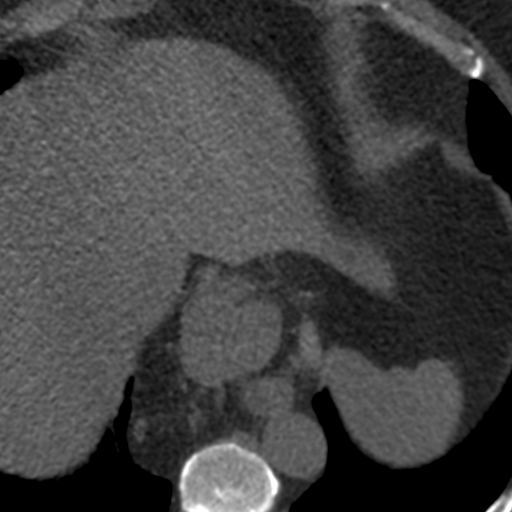
[im 23/57  vessel]
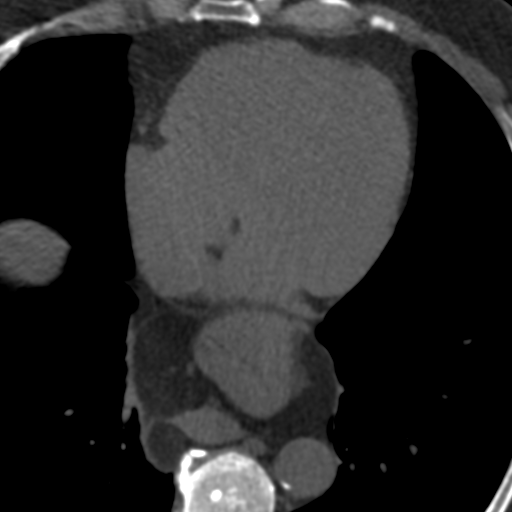
[im 34/57  vessel]
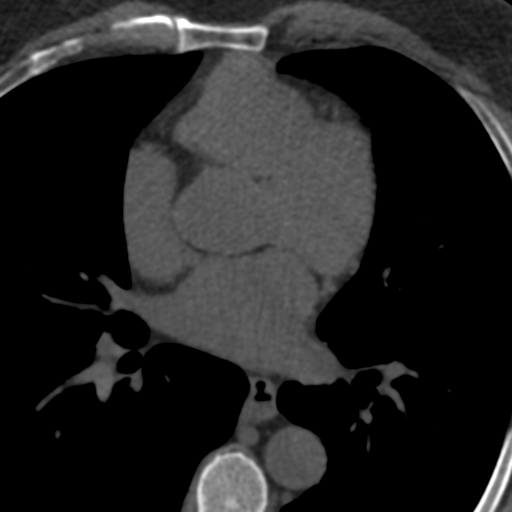
[im 45/57  vessel]
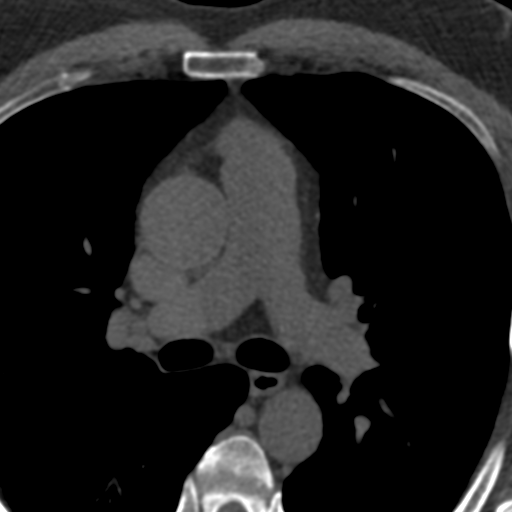

[Series 5: full fov st calcium scoring 3.00 · axial · 0.71mm/px · z∈[-1124,-1013]mm · 5 of 57 slices shown, 7 images]
[im 10/57  vessel]
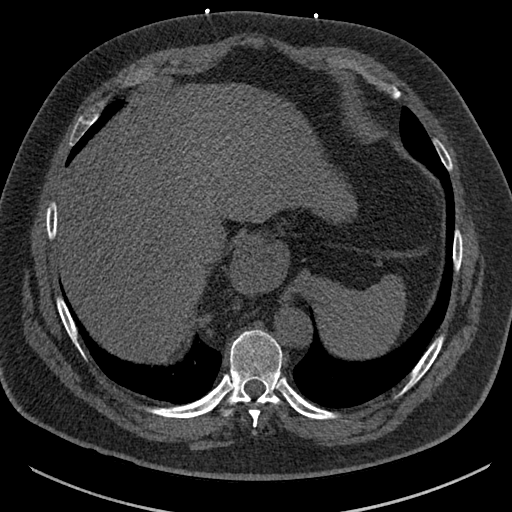
[im 10/57  lung]
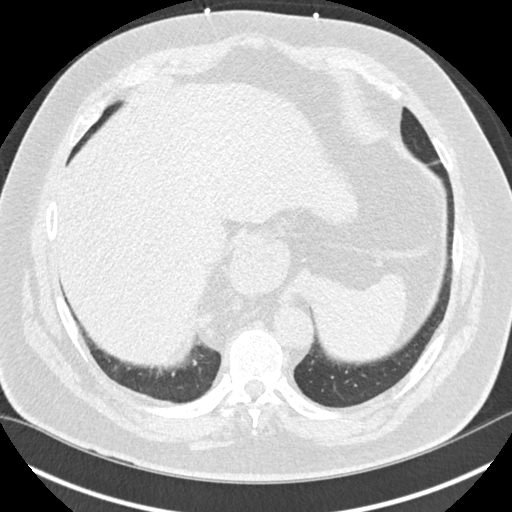
[im 19/57  vessel]
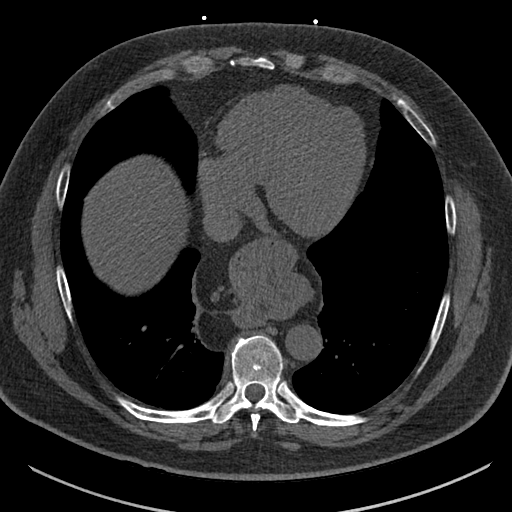
[im 29/57  vessel]
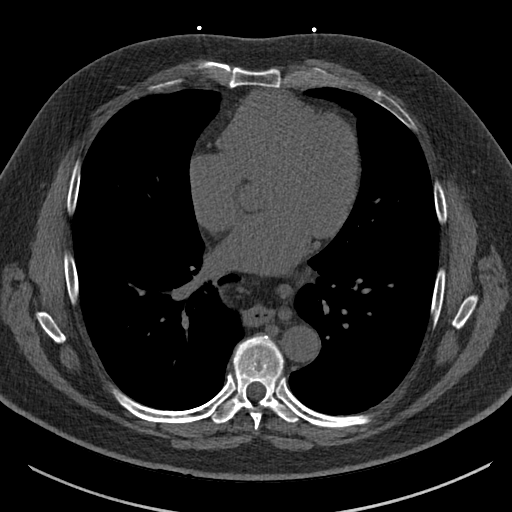
[im 38/57  vessel]
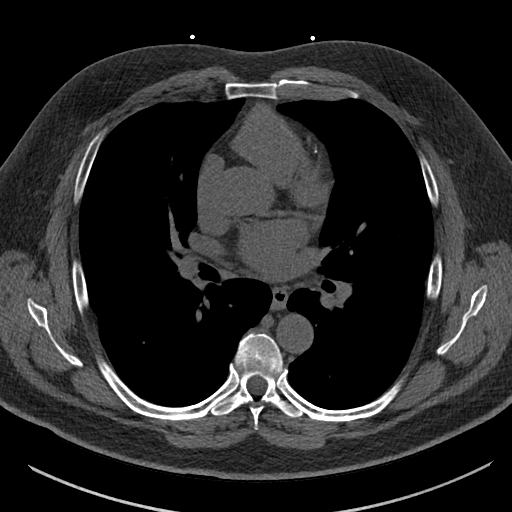
[im 47/57  vessel]
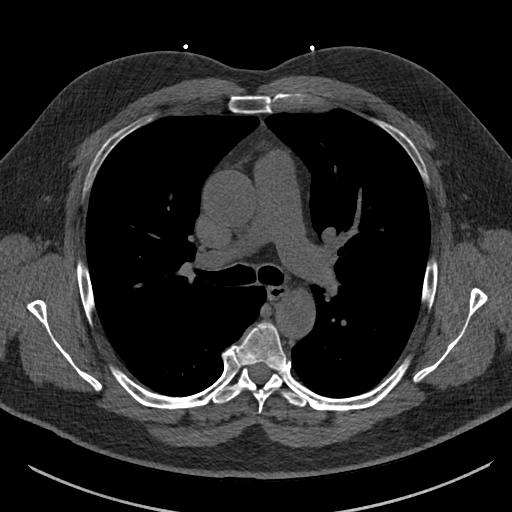
[im 47/57  lung]
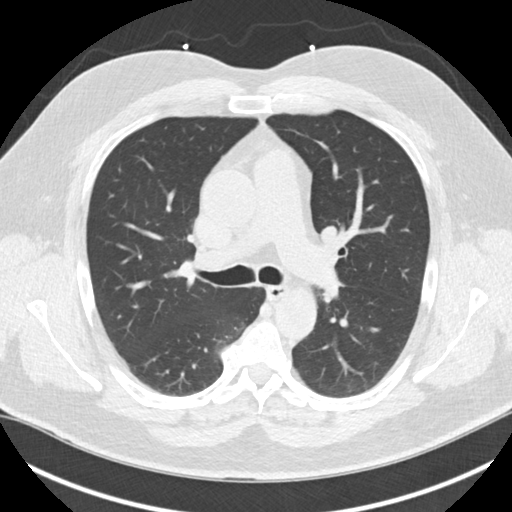

[Series 10: full fov lungs calcium scoring 3.00 ax · axial · 0.71mm/px · z∈[-1124,-1013]mm · 5 of 57 slices shown]
[im 10/57  vessel]
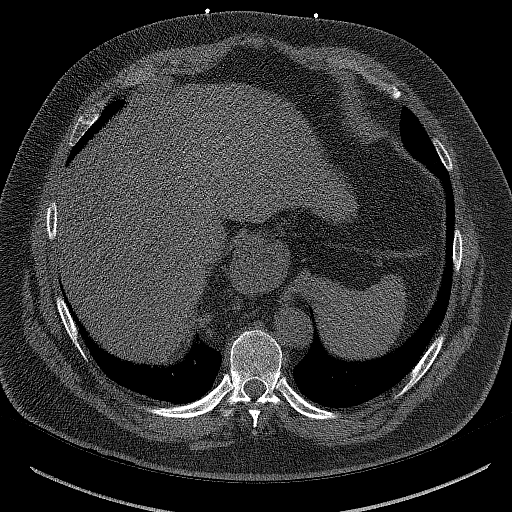
[im 19/57  vessel]
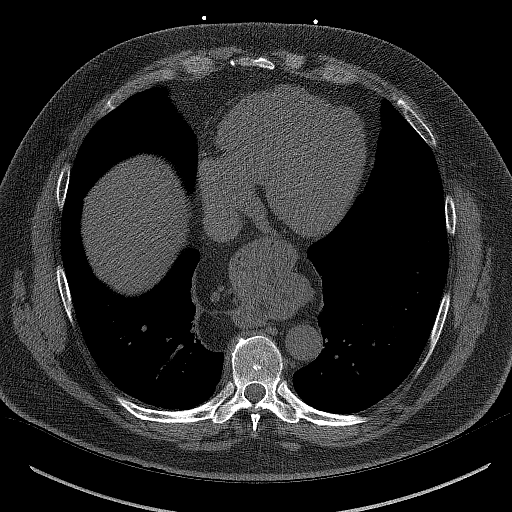
[im 29/57  vessel]
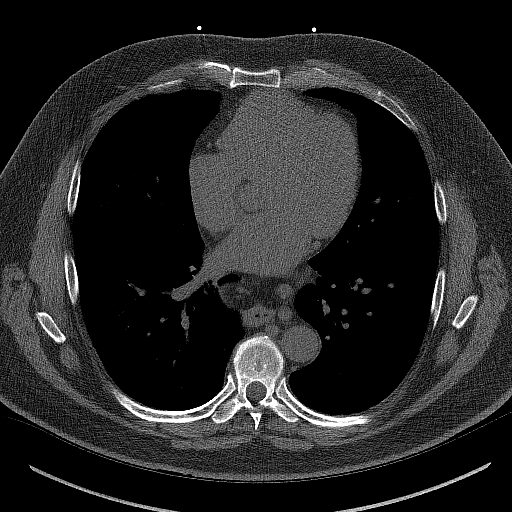
[im 38/57  vessel]
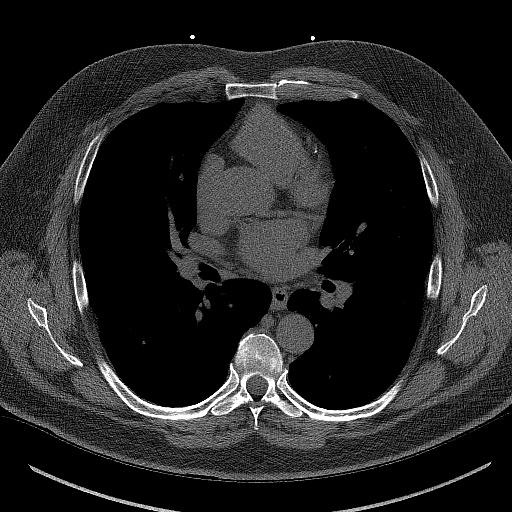
[im 47/57  vessel]
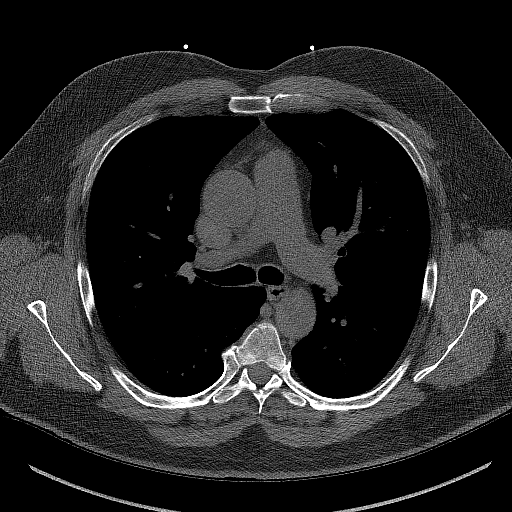

[14 of 20 positions shown; findings below may reference images not displayed]

FINDINGS: Vascular: No significant noncardiac vascular findings.

Mediastinum/Nodes: There is a large hiatal hernia. There are some
associated prominent paraesophageal lymph nodes in the hernia sac
which are nonspecific but likely reactive. The largest of these
measures approximately 9 mm in short axis.

Lungs/Pleura: Tiny calcified granuloma at the posterior right lung
base on image 49 and additional tiny 2 mm subpleural nodule at the
same level at the posteromedial right lung base. 3 mm peripheral
nodule in the posterolateral left lower lobe on image 40. Visualized
lungs show no evidence of pulmonary edema, consolidation,
pneumothorax or pleural fluid.

Upper Abdomen: There is evidence of hepatic steatosis and potential
early cirrhotic morphology of the visualized liver.

Musculoskeletal: No chest wall mass or suspicious bone lesions
identified.
IMPRESSION: 1. Large hiatal hernia. Associated prominent paraesophageal lymph
nodes in the hernia sac are nonspecific but likely reactive.
2. Tiny calcified granuloma at the right lung base. Additional 2 mm
and 3 mm bilateral peripheral lower lobe pulmonary nodules are most
likely postinflammatory in nature. No follow-up needed if patient is
low-risk (and has no known or suspected primary neoplasm).
Non-contrast chest CT can be considered in 12 months if patient is
high-risk. This recommendation follows the consensus statement:
Guidelines for Management of Incidental Pulmonary Nodules Detected
3. Evidence of hepatic steatosis and potential early cirrhosis of
the liver.
FINDINGS: Non-cardiac: See separate report from [REDACTED].

Ascending Aorta: Normal size

Pericardium: Normal

Coronary arteries: Normal origin of left and right coronary
arteries. Distribution of arterial calcifications if present, as
noted below;

LM 0

LAD

LCx 0

RCA

Total 101

IMPRESSION AND RECOMMENDATION:
1. Coronary calcium score of 101. This was 73rd percentile for age
and sex matched control.

2. CAC 100-299 in LAD and RCA. POLIN BILLIOT2/N2.

3. Recommend aspirin and statin if no contraindications.

4. Continue heart healthy lifestyle and risk factor modification.

Savio Locklear

*** End of Addendum ***
EXAM:
OVER-READ INTERPRETATION  CT CHEST

The following report is an over-read performed by radiologist Dr.
Barkati Hine [REDACTED] on 11/11/2021. This
over-read does not include interpretation of cardiac or coronary
anatomy or pathology. The coronary calcium score interpretation by
the cardiologist is attached.
FINDINGS: Vascular: No significant noncardiac vascular findings.

Mediastinum/Nodes: There is a large hiatal hernia. There are some
associated prominent paraesophageal lymph nodes in the hernia sac
which are nonspecific but likely reactive. The largest of these
measures approximately 9 mm in short axis.

Lungs/Pleura: Tiny calcified granuloma at the posterior right lung
base on image 49 and additional tiny 2 mm subpleural nodule at the
same level at the posteromedial right lung base. 3 mm peripheral
nodule in the posterolateral left lower lobe on image 40. Visualized
lungs show no evidence of pulmonary edema, consolidation,
pneumothorax or pleural fluid.

Upper Abdomen: There is evidence of hepatic steatosis and potential
early cirrhotic morphology of the visualized liver.

Musculoskeletal: No chest wall mass or suspicious bone lesions
identified.
IMPRESSION: 1. Large hiatal hernia. Associated prominent paraesophageal lymph
nodes in the hernia sac are nonspecific but likely reactive.
2. Tiny calcified granuloma at the right lung base. Additional 2 mm
and 3 mm bilateral peripheral lower lobe pulmonary nodules are most
likely postinflammatory in nature. No follow-up needed if patient is
low-risk (and has no known or suspected primary neoplasm).
Non-contrast chest CT can be considered in 12 months if patient is
high-risk. This recommendation follows the consensus statement:
Guidelines for Management of Incidental Pulmonary Nodules Detected
3. Evidence of hepatic steatosis and potential early cirrhosis of
the liver.

## 2024-04-06 ENCOUNTER — Other Ambulatory Visit: Payer: Self-pay | Admitting: Internal Medicine

## 2024-04-06 DIAGNOSIS — Z01818 Encounter for other preprocedural examination: Secondary | ICD-10-CM

## 2024-04-06 DIAGNOSIS — I4891 Unspecified atrial fibrillation: Secondary | ICD-10-CM

## 2024-04-06 DIAGNOSIS — I2510 Atherosclerotic heart disease of native coronary artery without angina pectoris: Secondary | ICD-10-CM

## 2024-04-21 ENCOUNTER — Ambulatory Visit
Admission: RE | Admit: 2024-04-21 | Discharge: 2024-04-21 | Disposition: A | Discharge status: Home / Self Care | Payer: Self-pay | Source: Ambulatory Visit | Attending: Internal Medicine | Admitting: Internal Medicine

## 2024-04-21 ENCOUNTER — Other Ambulatory Visit

## 2024-04-21 DIAGNOSIS — I2510 Atherosclerotic heart disease of native coronary artery without angina pectoris: Secondary | ICD-10-CM | POA: Insufficient documentation

## 2024-04-21 DIAGNOSIS — Z01818 Encounter for other preprocedural examination: Secondary | ICD-10-CM | POA: Insufficient documentation

## 2024-04-21 DIAGNOSIS — I4891 Unspecified atrial fibrillation: Secondary | ICD-10-CM | POA: Insufficient documentation
